# Patient Record
Sex: Male | Born: 2000
Health system: Western US, Academic
[De-identification: ages and names within clinical notes are randomized; demographics above are authoritative.]

## PROBLEM LIST (undated history)

## (undated) DIAGNOSIS — T148XXA Other injury of unspecified body region, initial encounter: Secondary | ICD-10-CM

## (undated) HISTORY — PX: SURGICAL HX OTHER: 99

## (undated) HISTORY — PX: TOTAL SHOULDER ARTHROPLASTY: SHX126

## (undated) HISTORY — PX: WISDOM TOOTH EXTRACTION: SHX5011

## (undated) HISTORY — PX: NASAL SEPTUM SURGERY: SHX37

## (undated) HISTORY — PX: TONSILLECTOMY AND ADENOIDECTOMY: SHX11139

## (undated) DEATH — deceased

---

## 2020-07-11 IMAGING — DX DX Fingers RT
1 series · 3 of 3 positions shown · non-contrast
Comparison: None available.

EXAM: Right fourth finger x-ray, 3 views
INDICATION: Pain

[Series 1: pa · right · 0.17mm/px · 3 of 3 slices shown]
[im 1/3]
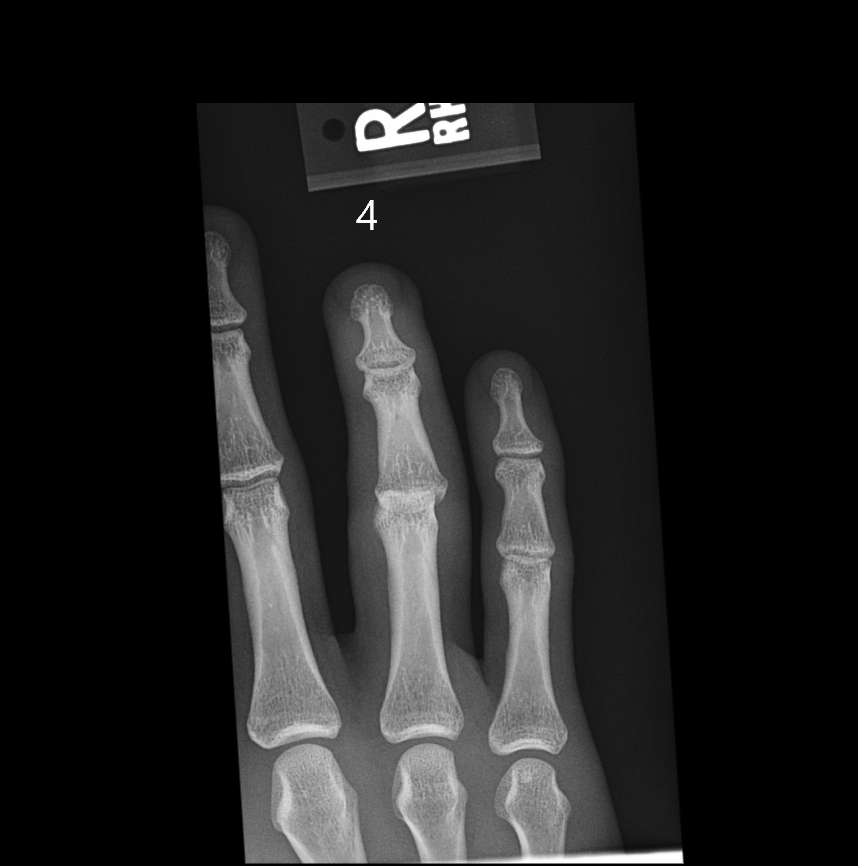
[im 2/3]
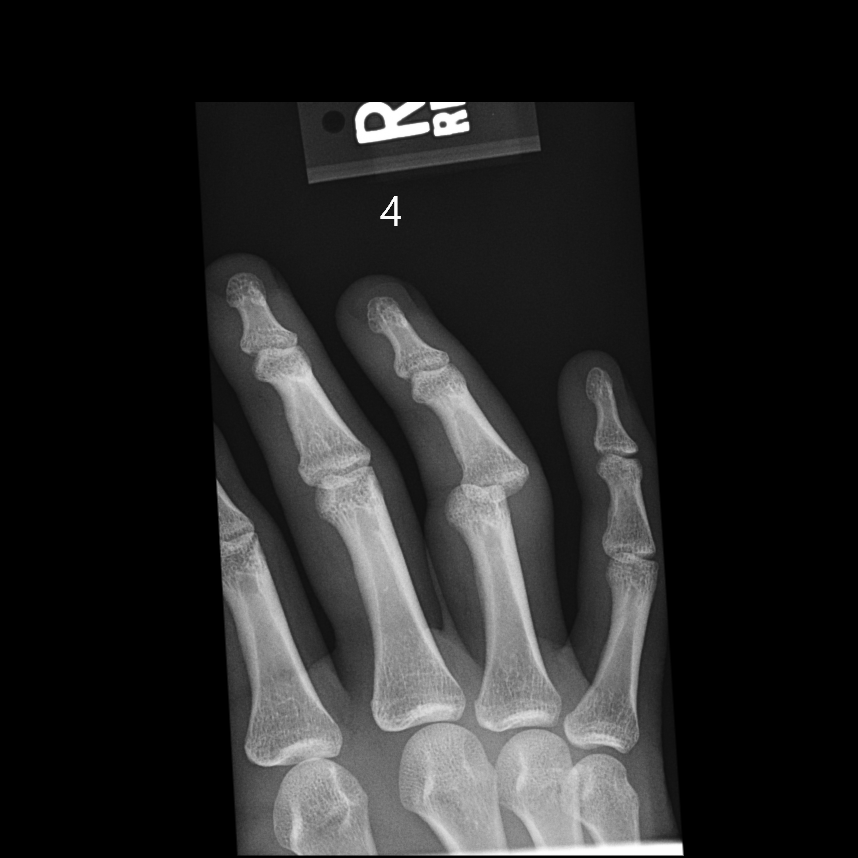
[im 3/3]
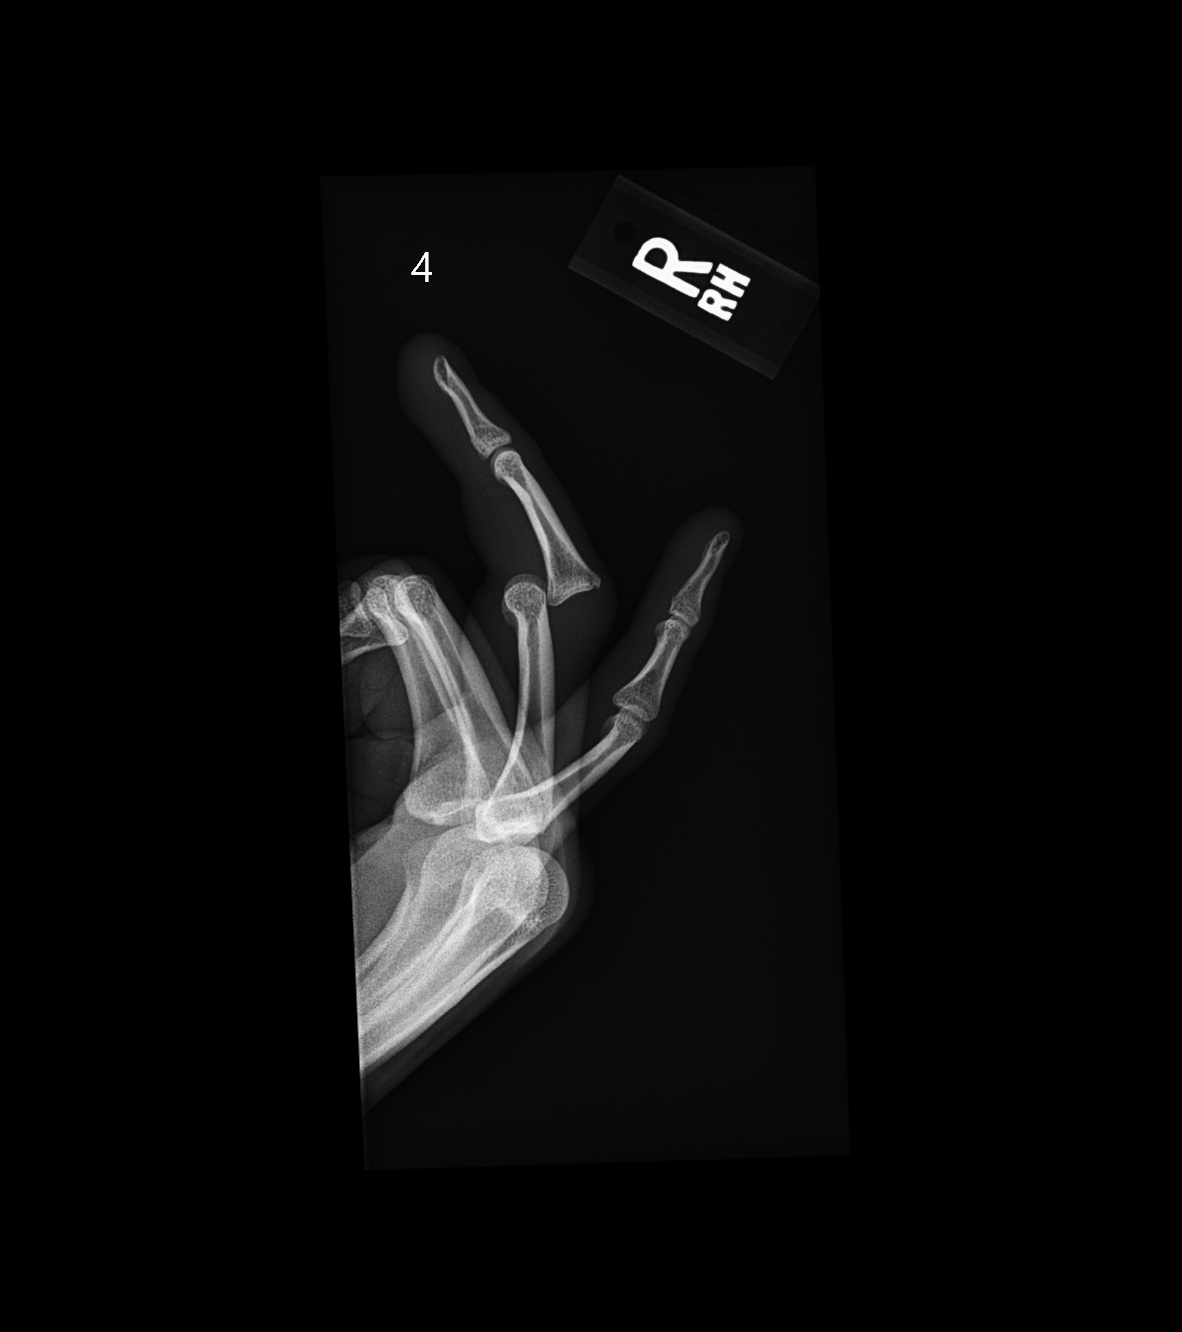

[3 of 3 positions shown; findings below may reference images not displayed]

IMPRESSION: Posterior proximal interphalangeal joint dislocation with 7 mm                
 overriding. Nondisplaced dorsal fracture of the middle phalanx at the joint               
 space. Bones are otherwise intact with normal joint alignment.

## 2020-11-25 ENCOUNTER — Other Ambulatory Visit (HOSPITAL_COMMUNITY): Payer: Self-pay

## 2020-12-02 ENCOUNTER — Other Ambulatory Visit (HOSPITAL_COMMUNITY): Payer: Self-pay

## 2021-01-14 ENCOUNTER — Other Ambulatory Visit (HOSPITAL_COMMUNITY): Payer: Self-pay

## 2021-03-11 ENCOUNTER — Other Ambulatory Visit (HOSPITAL_COMMUNITY): Payer: Self-pay

## 2021-06-02 ENCOUNTER — Other Ambulatory Visit (HOSPITAL_COMMUNITY): Payer: Self-pay

## 2021-08-03 ENCOUNTER — Encounter (HOSPITAL_BASED_OUTPATIENT_CLINIC_OR_DEPARTMENT_OTHER): Payer: Self-pay | Admitting: Orthopaedic Surgery

## 2021-08-03 ENCOUNTER — Telehealth (HOSPITAL_BASED_OUTPATIENT_CLINIC_OR_DEPARTMENT_OTHER): Payer: Self-pay | Admitting: Orthopaedic Surgery

## 2021-08-03 NOTE — Telephone Encounter (Signed)
RETURN CALL: Voicemail - Detailed Message      SUBJECT:  General Message     MESSAGE: Patient is a new patien and is scheduled for 10/26 and would like to get a better idea of when he would be able to schedule his surgery. He would like to know this information because he is having someone travelling to come help him out after surgery and he wants to figure out travel dates and airfare dates.     Please call patient to discuss. Thank you.

## 2021-08-04 NOTE — Telephone Encounter (Signed)
I spoke with this patient and informed him Dr. Everlean Patterson would discuss a date with him after he meets him in clinic. Fredricka Bonine is a new patient and it would not be appropriate give him a surgical date. During his appointment the can discuss how long the patient needs to have someone fly here to help him.    Kevin Simmons   Patient Care Coordinator  [facility] Department of Orthopedics  [phone]

## 2021-08-10 ENCOUNTER — Other Ambulatory Visit (HOSPITAL_COMMUNITY): Payer: Self-pay

## 2021-08-17 ENCOUNTER — Other Ambulatory Visit (HOSPITAL_BASED_OUTPATIENT_CLINIC_OR_DEPARTMENT_OTHER): Payer: Self-pay

## 2021-08-17 DIAGNOSIS — S82202D Unspecified fracture of shaft of left tibia, subsequent encounter for closed fracture with routine healing: Secondary | ICD-10-CM

## 2021-08-24 ENCOUNTER — Telehealth (HOSPITAL_BASED_OUTPATIENT_CLINIC_OR_DEPARTMENT_OTHER): Payer: Self-pay | Admitting: Orthopaedic Surgery

## 2021-08-24 NOTE — Telephone Encounter (Signed)
RETURN CALL: Voicemail - Detailed Message      SUBJECT:  General Message     MESSAGE: New patient scheduled to see [facility] Orth[facility] Fracture on Wednesday, 08/26/21 at 10am wanting to confirm outside medical records from  [facility] system x-rays, consultation records from patient's first surgery, have been received. Please call patient back directly prior to his appointment to confirm.

## 2021-08-25 NOTE — Telephone Encounter (Signed)
RETURN CALL: Voicemail - Detailed Message      SUBJECT:  General Message     MESSAGE:  Patient is following up again on medical records and wanting to know if clinic has received them. Please call patient back to further assist

## 2021-08-26 ENCOUNTER — Encounter (HOSPITAL_BASED_OUTPATIENT_CLINIC_OR_DEPARTMENT_OTHER): Payer: Self-pay

## 2021-08-26 ENCOUNTER — Ambulatory Visit
Admission: RE | Admit: 2021-08-26 | Discharge: 2021-08-26 | Disposition: A | Discharge status: Home / Self Care | Payer: No Typology Code available for payment source | Source: Ambulatory Visit | Attending: Diagnostic Radiology | Admitting: Diagnostic Radiology

## 2021-08-26 ENCOUNTER — Ambulatory Visit (HOSPITAL_BASED_OUTPATIENT_CLINIC_OR_DEPARTMENT_OTHER): Payer: No Typology Code available for payment source | Admitting: Physician Assistant

## 2021-08-26 VITALS — BP 115/62 | HR 93 | Temp 97.9°F | Resp 18 | Ht 70.0 in | Wt 152.0 lb

## 2021-08-26 DIAGNOSIS — S82422K Displaced transverse fracture of shaft of left fibula, subsequent encounter for closed fracture with nonunion: Secondary | ICD-10-CM

## 2021-08-26 DIAGNOSIS — S82202D Unspecified fracture of shaft of left tibia, subsequent encounter for closed fracture with routine healing: Secondary | ICD-10-CM | POA: Insufficient documentation

## 2021-08-26 NOTE — Progress Notes (Signed)
OUTPATIENT ORTHOPEDIC TRAUMA CLINIC NOTE     08/26/2021    ATTENDING SURGEON: Dr. Sherry Ruffing    Patient was seen with attending today.     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  In January of this year he was playing rugby and was tackled and had immediate onset of left leg pain.  He was evaluated by the sports medicine team there and noted to have tibia and fibular shaft fractures.  He was ultimately treated the next day the next day with an intramedullary nail of the tibia and the fibula was treated nonoperatively cleared.    Patient has followed up with his surgeon in [place] and at the last telemedicine visit in August was noted to have nonunion of the fibula and was recommended to undergo open reduction internal fixation.  Patient states that he has ongoing difficulties with the left leg.  He does not specifically describe pain but feels that the left leg is weak and there is a aching in the posterior portion of his leg with any type of exertion.  He has been unable to return to running jogging or other sports and desires to do so.      Since the injury he is had decreased sensation in the lateral and dorsal portion of his left foot this is improved over time but continues to remain decreased compared to the contralateral limb.  He otherwise has normal range of motion in the ankle and the knee.  He does not report any significant knee pain.      PAST MEDICAL HISTORY:  Anxiety  Erectile disfunction       MEDICATIONS  taldenofil 5-20mg  per day  Cabergoline 0.5mg  twice a week  Xanax 0.5 tid    Sudafed prn  mucinex prn  Tylenol prn    PAST SURGICAL HISTORY   IMN left tibia 11/2020       SOCIAL HISTORY      lives alone    Current work status: Sport and exercise psychologist  Tobacco use: NO  Alchohol use: YES; 14 per week    REVIEW OF SYSTEMS  Denies recent chest  pain, shortness of breath, palpitations, cough, runny nose, sore throat, fevers, chills, diarrhea, nausea and vomiting.       PHYSICAL EXAMINATION:   BP 115/62    Pulse 93    Temp 36.6 C (Temporal)    Resp 18    Ht 5\' 10"  (1.778 m)    Wt 68.9 kg (152 lb)    SpO2 99%    BMI 21.81 kg/m    GENERAL: is alert, oriented, appropriate, in no acute distress.      CARDIOVASCULAR:  Regular rate and rhythm.   normal capillary refill. Extremities are warm and well perfused.    LUNGS:normal respiratory effort.        FOCUSED MUSCULOSKELETAL EXAM:   MUSCULOSKELETAL: left lower extremity   Inspection: Incisions well healed, no drainage or surrounding erythema.  no edema noted on inspection.   Palpation/Tenderness: no calf tenderness to palpation     range of motion 0-130,   ankle dorsiflexion to +5, plantarflexion to 30.    Motor:   Quadriceps 5/5  Hamstrings 5/5  gastrocsoleus complex 5/5  tibialis anterior 5/5  EHL 5/5  FHL 5/5    With functional  testing he has obvious difficulty performing a single leg toe raise on the left and noted increased discomfort posteriorly after attempts to do so.   He performed 5 squats and noted increased pain in the left calf and posterior leg after doing so.     Sensation: is intact to light touch along tibial, sural, saphenous, deep and superficial peroneal nerve distributions. It is decreased in the lateral and dorsum of the foot and all toes.   Vascular:  isWarm and well-perfused, dorsalis pedis and posterior tibial pulse 2+.          IMAGING:     Plain Radiographs:  2 views of the LEFT  leg were obtained in clinic today and independently reviewed showing:  Well healed tibia fracture with well placed and well fixed IMN. No loosening or failure noted.   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     We recommend ORIF left fibula. This will be outpatient procedure with immediate wbat. Crutches from comfort if needed.   Will need 2-3 week wound check.     I have reviewed the expected post-operative recovery and physical therapy goals with the patient. We have discussed appropriate pain management goals and strategies. The patient was provided a handout regarding prescription opioids and the associated risks.      The nursing staff has reviewed the pre operative instructions and a copy was provided to the patient. Any relevant medications changes were made in writing and provided to the patient.      Risks and benefits of the procedure were discussed with the patient in depth. Risks include but are not limited to bleeding, infection, damage to surrounding structures,  incomplete resolution of pain, post operative infection, possible hardware failure, nonunion, need for further surgery, anesthetic complications including DVT and PE, heart attack, stroke or even death. he understands these risks and still wishes to proceed with the procedure.  Written consent was obtained from the patient and placed in the chart.    The patient was given ample opportunity to ask questions and all have been answered to their apparent satisfaction.     The patient was also seen with Dr. Sherry Ruffing who reviewed the surgical plan, risks and benefits with the patient.        No orders of the defined types were placed in this encounter.      Trisha Mangle, PA-C
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
## 2021-08-26 NOTE — Patient Instructions (Signed)
It was nice to see you today. Please let me know if you have any follow up questions that did not get answered.       We have discussed open reduction internal fixation of the left fibula nonunion. This will be a day surgery meaning you can go home an hour or so after surgery. You will need someone to pick you up from the hospital. You will be allowed to walk as tolerated. You may want to have crutches from comfort. Your wound will need to be checked about 2 weeks after surgery.     You should plan to start PT about 6 weeks after surgery.       Contact the clinic if concerning new symptoms    Ruben Gottron    [phone]  Orthopedic Trauma Clinic    [phone]  Ortho Pharmacy refill requests    For disability or work related forms -- Nicanor Bake [phone]    Orthopedic Trauma clinic   Patient Care Coordinators  Fax number: [phone]    Frutoso Chase [phone]     Haskell Riling: [phone]    Arrie Aran: [phone]

## 2021-08-31 ENCOUNTER — Encounter (HOSPITAL_BASED_OUTPATIENT_CLINIC_OR_DEPARTMENT_OTHER): Payer: No Typology Code available for payment source | Admitting: Medical

## 2021-09-21 ENCOUNTER — Telehealth (HOSPITAL_COMMUNITY): Payer: Self-pay

## 2021-09-21 ENCOUNTER — Ambulatory Visit: Payer: 59

## 2021-09-21 ENCOUNTER — Encounter (HOSPITAL_COMMUNITY): Payer: Self-pay | Admitting: Orthopaedic Surgery

## 2021-09-21 NOTE — Telephone Encounter (Signed)
PAC: Pre-Anesthesia instructions sent via My Chart on 09/21/2021

## 2021-09-21 NOTE — Preprocedure Instructions (Signed)
For any unexpected problems (such as cold, flu, or fever) or if you are unable to keep your appointment call one of the following:   The operating room-the evening prior to surgery after 8 pm, [phone]  Call ambulatory surgery unit [phone] on the day of surgery.     PRE ANESTHESIA INSTRUCTIONS:       Check in on the GROUND FLOOR of the [facility] BUILDING  on date and arrival time provided by your Surgeon's Office.     If you do not already have an arrival time: you will receive a call in the evening on the day before your surgery with your check in time.     If your surgery is on a Monday,  you will receive a call in the evening on Friday       Pre procedure instructions:     Drink plenty of water (6-8 glasses) the day BEFORE surgery.     Do NOT eat 6 hours prior to arrival.     Up until 2 hours prior to arrival you may have CLEAR liquids only (water, apple juice, Gatorade or Ensure clear) NO MILK OR MILK PRODUCTS.     Take: Tylenol if needed,  Alprazolam if needed, on the day of surgery with a small sip of water.     HOLD: Vitamins and supplements on the day of surgery    HOLD: Tadalafil for 1 day before surgery.    If you use inhalers, please use your inhalers on the morning of surgery and bring them with you to the hospital.    HOLD NSAIDS (ibuprofen, motrin, naproxen, aleve, ketorolac or diclofenac) 7 days prior to surgery.  Tylenol (acetaminophen is ok to take)    HOLD fish oil, flax seed oil and all herbal supplements 7 days prior to surgery.      If you have been diagnosed with Sleep Apnea, please bring your CPAP machine with you to the hospital on the day of surgery    NOTE: If a medication is marked TAKE OR HOLD, and you take that medication at night, THEN take it as your normally do the night prior.     Directions to [facility] Surgery Check-In Desk  BY CAR: Park in P2 garage (entrance is on [address], between [employer] and [employer] [address])   Take the parking garage elevators to L level [employer]).    Walk outside and you will see the Emergency and Trauma Center entrance on the opposite corner. Enter there.  Proceed through Covid Screening.  From there take the [facility] elevator up to 6th floor and cross the sky bridge.   Once over the sky bridge you will be in the [employer].   Take the [facility] elevator down to floor [address] [employer])   The check in is across the hall on your right.

## 2021-09-21 NOTE — Anesthesia Preprocedure Evaluation (Addendum)
Patient: Kevin Simmons    Procedure Information     Date/Time: 10/01/21 1015    Procedure: OPEN TREATMENT, FRACTURE, FIBULA, PROXIMAL OR SHAFT, WITH FIXATION (Left: Leg Lower)    Location: [facility] MAIN OR 10 / [facility] MAIN OR    Surgeons: Crissie Reese, MD        HPI: Pt is a 20 year old male with history of tib/fib fx s/p IM nail tib/fib. Presents with painful nonunion of left fibula shaft. Plan for ORIF left fibula.     Relevant Problems   No relevant active problems     Relevant surgical history:   Past Surgical History:   Procedure Laterality Date    IM Nail Tib/Fib Left     LAPAROSCOPIC ORCHIOPEXY 2002      NASAL SEPTUM SURGERY      TONSILLECTOMY AND ADENOIDECTOMY      TOTAL SHOULDER ARTHROPLASTY      WISDOM TOOTH EXTRACTION           Medications:     Outpatient:   Current Outpatient Medications   Medication Instructions    acetaminophen (TYLENOL) 500-1,000 mg, Oral, Every 6 hours PRN    ALPRAZolam 0.5 MG tablet TAKE 1 AND 1/2 TABLET BY MOUTH TWICE DAILY AS NEEDED FOR ANXIETY    cabergoline 0.5 MG tablet TAKE 1 TABLET (0.5 MG TOTAL) BY MOUTH 2 (TWO) TIMES A WEEK.    tadalafil (CIALIS) 5 mg, Oral, Daily                Review of patient's allergies indicates:  Allergies   Allergen Reactions    Aspirin Skin: Hives and Unknown    Ibuprofen Skin: Hives, Skin: Itching and Unknown       Social History:   Social History     Tobacco Use    Smoking status: Unknown   Substance Use Topics    Alcohol use: Yes     Alcohol/week: 14.0 standard drinks     Types: 14 Standard drinks or equivalent per week    Drug use: Yes     Types: Marijuana       Medical History and Review of Systems  Documentation reviewed: patient health questionnaire and electronic medical record.    Source of information: Chart review.  Previous anesthesia: Yes   History of anesthetic complications  (-) History of anesthetic complications.  (-) family history of anesthetic complications.      Functional Status   Able to lay flat and  still for 30 minutes, able to climb 2 flights of stairs or more without stopping, able to walk 1 city block (200 yards) and unable to exercise due to physical limitation.       Pulmonary   (+) productive cough (daily phlegm)    Neuro/Psych   (+) psychiatric history    Cardiovascular   Neg cardio ROS    HEENT   Neg HEENT ROS    Musculoskeletal  painful nonunion of left fibula shaft    Skin   negative skin ROS    GI/Hepatic/Renal   (+) GERD  (+) genitourinary problem (ED)    Endo/Immunology   neg endo/other ROS    Hematology   negative hematology ROS  Oncology   negative hematology/oncology ROS            Physical Exam  Airway  Mallampati:  I  TM distance:  >6 cm  Neck ROM:  Full    Dental    Cardiovascular  normal  Pulmonary  normal           Height:  [5\' 10"  (177.8 cm)] 5\' 10"  (177.8 cm)  Weight:  [68.9 kg (152 lb)] 68.9 kg (152 lb)   Body mass index is 21.81 kg/m.    Labs: (last year)   No labs identified within the last year      Relevant procedures / diagnostic studies:     PRE-ANESTHESIA CLINIC DISCUSSION   Assessment / Additional Concerns:      Instructions sent via My Chart on 09/21/2021.   Informed Consent:    Use of blood products:                ANESTHESIA PLAN   Informed Consent:     Anesthesia Plan discussed with:        Patient    ASA Score:     ASA: 1  Planned Anesthetic Type:      general      Risk Calculators / Scores:     PONV: Low Risk  Total Score: 1            Non-smoker        Criteria that do not apply:    Male patient    History of PONV    History of motion sickness    Intended opioid administration

## 2021-09-30 ENCOUNTER — Ambulatory Visit (HOSPITAL_BASED_OUTPATIENT_CLINIC_OR_DEPARTMENT_OTHER): Payer: Self-pay

## 2021-09-30 ENCOUNTER — Telehealth (HOSPITAL_BASED_OUTPATIENT_CLINIC_OR_DEPARTMENT_OTHER): Payer: Self-pay

## 2021-09-30 NOTE — Telephone Encounter (Signed)
Call Type:  Triage Call    Caller Name:  Ann Maki @Contact  Center    Facility Name:  [facility] [phone]    Presenting Problem:  PT has surgery tomorrow and has some questions. Clinic hasn't responded and they are closed. Went for pre-op and forgot to list Adderall under one of their medications, is this going to be a problem? Hasn't taken it today. No call from team today.    Assessment:  01. Symptoms: What are your symptoms? 2nd attempt, VM left  02. Onset: When did your symptoms/concern begin? (minutes, hours, days, or months ago?) n/a  03. Action: What actions or treatments have you taken to treat your symptoms/concern? n/a  04. Response: What was the response to your actions taken? n/a  05. Severity: How severe are your symptoms? (ex. interference with normal activities, pain scale, size or appearance of injury) n/a    Guideline Title:  No Contact or Duplicate Contact Call (Adult)    Guideline Question:  [1] Message left on unidentified voice mail.  Phone number verified.? Yes    Recommended Disposition:  No contact call    Original Inclination:  No Contact    Intended Action:  No contact call    Care Advice:  Care Advice given per No Contact or Duplicate Contact Call (Adult) guideline.  Note to [employer] - Unidentified Voice Mail:  * Follow organization policy for leaving a voice mail on an unidentified voice mail (e.g., no details about personal health information or nature of call).

## 2021-09-30 NOTE — Telephone Encounter (Signed)
RETURN CALL: Voicemail - Detailed Message      SUBJECT:  General Message     MESSAGE: Patient called back for an update. Ccr transferred to [facility] Medicine Nurse Line.

## 2021-09-30 NOTE — Telephone Encounter (Signed)
RETURN CALL: Voicemail - Detailed Message      SUBJECT:  General Message     MESSAGE: Patient has surgery tomorrow, 12/1, with Dr Everlean Patterson, and is concerned that his adderall prescription wasn't listed in his medication history, and has other questions regarding his procedure, please advise, thank you!

## 2021-10-01 ENCOUNTER — Other Ambulatory Visit (HOSPITAL_BASED_OUTPATIENT_CLINIC_OR_DEPARTMENT_OTHER): Payer: Self-pay

## 2021-10-01 ENCOUNTER — Ambulatory Visit (HOSPITAL_BASED_OUTPATIENT_CLINIC_OR_DEPARTMENT_OTHER): Payer: No Typology Code available for payment source

## 2021-10-01 ENCOUNTER — Encounter (HOSPITAL_COMMUNITY): Payer: Self-pay

## 2021-10-01 ENCOUNTER — Encounter (HOSPITAL_COMMUNITY)
Admission: RE | Disposition: A | Discharge status: Home / Self Care | Payer: Self-pay | Source: Home / Self Care | Attending: Orthopaedic Surgery

## 2021-10-01 ENCOUNTER — Ambulatory Visit (HOSPITAL_COMMUNITY): Payer: No Typology Code available for payment source

## 2021-10-01 ENCOUNTER — Ambulatory Visit
Admission: RE | Admit: 2021-10-01 | Discharge: 2021-10-01 | Disposition: A | Discharge status: Home / Self Care | Payer: No Typology Code available for payment source | Attending: Orthopaedic Surgery | Admitting: Orthopaedic Surgery

## 2021-10-01 ENCOUNTER — Ambulatory Visit (HOSPITAL_COMMUNITY): Payer: Self-pay | Admitting: Orthopaedic Surgery

## 2021-10-01 DIAGNOSIS — Z9889 Other specified postprocedural states: Secondary | ICD-10-CM | POA: Insufficient documentation

## 2021-10-01 DIAGNOSIS — S82452K Displaced comminuted fracture of shaft of left fibula, subsequent encounter for closed fracture with nonunion: Secondary | ICD-10-CM

## 2021-10-01 DIAGNOSIS — X58XXXD Exposure to other specified factors, subsequent encounter: Secondary | ICD-10-CM | POA: Insufficient documentation

## 2021-10-01 DIAGNOSIS — S82422K Displaced transverse fracture of shaft of left fibula, subsequent encounter for closed fracture with nonunion: Secondary | ICD-10-CM

## 2021-10-01 DIAGNOSIS — Z79899 Other long term (current) drug therapy: Secondary | ICD-10-CM | POA: Insufficient documentation

## 2021-10-01 HISTORY — DX: Other injury of unspecified body region, initial encounter: T14.8XXA

## 2021-10-01 SURGERY — OPEN TREATMENT, FRACTURE, FIBULA, PROXIMAL OR SHAFT, WITH FIXATION
Anesthesia: General | Site: Leg Lower | Laterality: Left | Wound class: Class I/ Clean

## 2021-10-01 MED ORDER — EPHEDRINE SULFATE (PRESSORS) 25 MG/5ML IV SOSY
PREFILLED_SYRINGE | INTRAVENOUS | Status: DC | PRN
Start: 2021-10-01 — End: 2021-10-01
  Administered 2021-10-01 (×2): 10 mg via INTRAVENOUS
  Administered 2021-10-01: 5 mg via INTRAVENOUS

## 2021-10-01 MED ORDER — HYDROMORPHONE HCL 1 MG/ML IJ SOLN
0.2000 mg | INTRAMUSCULAR | Status: DC | PRN
Start: 2021-10-01 — End: 2021-10-01
  Administered 2021-10-01 (×2): 0.4 mg via INTRAVENOUS

## 2021-10-01 MED ORDER — MIDAZOLAM HCL (PF) 2 MG/2ML IJ SOLN
INTRAMUSCULAR | Status: AC
Start: 2021-10-01 — End: 2021-10-01
  Filled 2021-10-01: qty 2

## 2021-10-01 MED ORDER — NEOSTIGMINE METHYLSULFATE 5 MG/10ML IV SOLN
INTRAVENOUS | Status: DC | PRN
Start: 2021-10-01 — End: 2021-10-01
  Administered 2021-10-01: 3.5 mg via INTRAVENOUS

## 2021-10-01 MED ORDER — ROCURONIUM BROMIDE 50 MG/5ML IV SOLN
INTRAVENOUS | Status: DC | PRN
Start: 2021-10-01 — End: 2021-10-01
  Administered 2021-10-01: 20 mg via INTRAVENOUS
  Administered 2021-10-01: 70 mg via INTRAVENOUS

## 2021-10-01 MED ORDER — ACETAMINOPHEN 500 MG OR TABS
ORAL_TABLET | ORAL | Status: AC
Start: 2021-10-01 — End: 2021-10-01
  Filled 2021-10-01: qty 2

## 2021-10-01 MED ORDER — ONDANSETRON HCL 4 MG/2ML IJ SOLN
4.0000 mg | INTRAMUSCULAR | Status: DC | PRN
Start: 2021-10-01 — End: 2021-10-01

## 2021-10-01 MED ORDER — NEOSTIGMINE METHYLSULFATE 5 MG/10ML IV SOLN
INTRAVENOUS | Status: AC
Start: 2021-10-01 — End: 2021-10-01
  Filled 2021-10-01: qty 10

## 2021-10-01 MED ORDER — EPHEDRINE SULFATE (PRESSORS) 25 MG/5ML IV SOSY
PREFILLED_SYRINGE | INTRAVENOUS | Status: AC
Start: 2021-10-01 — End: 2021-10-01
  Filled 2021-10-01: qty 5

## 2021-10-01 MED ORDER — ROCURONIUM BROMIDE 50 MG/5ML IV SOLN
INTRAVENOUS | Status: AC
Start: 2021-10-01 — End: 2021-10-01
  Filled 2021-10-01: qty 10

## 2021-10-01 MED ORDER — ONDANSETRON HCL 4 MG/2ML IJ SOLN
INTRAMUSCULAR | Status: DC | PRN
Start: 2021-10-01 — End: 2021-10-01
  Administered 2021-10-01: 4 mg via INTRAVENOUS

## 2021-10-01 MED ORDER — ONDANSETRON HCL 4 MG/2ML IJ SOLN
INTRAMUSCULAR | Status: AC
Start: 2021-10-01 — End: 2021-10-01
  Filled 2021-10-01: qty 2

## 2021-10-01 MED ORDER — PHENYLEPHRINE HCL-NACL 1-0.9 MG/10ML-% IV SOSY
PREFILLED_SYRINGE | INTRAVENOUS | Status: DC | PRN
Start: 2021-10-01 — End: 2021-10-01
  Administered 2021-10-01 (×3): 100 ug via INTRAVENOUS

## 2021-10-01 MED ORDER — MIDAZOLAM HCL (PF) 1 MG/ML IJ SOLN WRAPPER (ANESTHESIA OSM ONLY)
INTRAMUSCULAR | Status: DC | PRN
Start: 2021-10-01 — End: 2021-10-01
  Administered 2021-10-01: 2 mg via INTRAVENOUS

## 2021-10-01 MED ORDER — PHENYLEPHRINE HCL-NACL 1-0.9 MG/10ML-% IV SOSY
PREFILLED_SYRINGE | INTRAVENOUS | Status: AC
Start: 2021-10-01 — End: 2021-10-01
  Filled 2021-10-01: qty 10

## 2021-10-01 MED ORDER — CEFAZOLIN SODIUM 1 G IJ SOLR
INTRAMUSCULAR | Status: DC | PRN
Start: 2021-10-01 — End: 2021-10-01
  Administered 2021-10-01: 2 g via INTRAVENOUS

## 2021-10-01 MED ORDER — HALOPERIDOL LACTATE 5 MG/ML IJ SOLN
1.0000 mg | Freq: Once | INTRAMUSCULAR | Status: DC | PRN
Start: 2021-10-01 — End: 2021-10-01

## 2021-10-01 MED ORDER — SODIUM CHLORIDE 0.9 % IV SOLN
INTRAVENOUS | Status: DC | PRN
Start: 2021-10-01 — End: 2021-10-01
  Administered 2021-10-01 (×3): 8 ug via INTRAVENOUS

## 2021-10-01 MED ORDER — IODINE TINCTURE 1% TOPICAL SOLUTION (UWDS)
Status: AC
Start: 2021-10-01 — End: 2021-10-01
  Filled 2021-10-01: qty 473

## 2021-10-01 MED ORDER — GLYCOPYRROLATE 0.2 MG/ML IJ SOLN
INTRAMUSCULAR | Status: AC
Start: 2021-10-01 — End: 2021-10-01
  Filled 2021-10-01: qty 3

## 2021-10-01 MED ORDER — DEXAMETHASONE SODIUM PHOSPHATE 4 MG/ML IJ SOLN
INTRAMUSCULAR | Status: DC | PRN
Start: 2021-10-01 — End: 2021-10-01
  Administered 2021-10-01: 4 mg via INTRAVENOUS

## 2021-10-01 MED ORDER — PLASMA-LYTE A IV SOLN
INTRAVENOUS | Status: DC | PRN
Start: 2021-10-01 — End: 2021-10-01

## 2021-10-01 MED ORDER — FENTANYL CITRATE (PF) 100 MCG/2ML IJ SOLN
INTRAMUSCULAR | Status: AC
Start: 2021-10-01 — End: 2021-10-01
  Filled 2021-10-01: qty 2

## 2021-10-01 MED ORDER — FENTANYL CITRATE (PF) 100 MCG/2ML IJ SOLN
12.5000 ug | INTRAMUSCULAR | Status: DC | PRN
Start: 2021-10-01 — End: 2021-10-01

## 2021-10-01 MED ORDER — OXYCODONE HCL 5 MG OR TABS
ORAL_TABLET | ORAL | Status: DC
Start: 2021-10-01 — End: 2021-10-01
  Filled 2021-10-01: qty 2

## 2021-10-01 MED ORDER — OXYCODONE HCL 5 MG OR TABS
5.0000 mg | ORAL_TABLET | ORAL | 0 refills | Status: DC | PRN
Start: 2021-10-01 — End: 2021-10-02
  Filled 2021-10-01: qty 25, 5d supply, fill #0

## 2021-10-01 MED ORDER — PROPOFOL 200 MG/20ML IV EMUL
INTRAVENOUS | Status: AC
Start: 2021-10-01 — End: 2021-10-01
  Filled 2021-10-01: qty 20

## 2021-10-01 MED ORDER — DEXAMETHASONE SODIUM PHOSPHATE 4 MG/ML IJ SOLN
INTRAMUSCULAR | Status: AC
Start: 2021-10-01 — End: 2021-10-01
  Filled 2021-10-01: qty 1

## 2021-10-01 MED ORDER — CEFAZOLIN SODIUM 1 G IJ SOLR
INTRAMUSCULAR | Status: AC
Start: 2021-10-01 — End: 2021-10-01
  Filled 2021-10-01: qty 2

## 2021-10-01 MED ORDER — HYDROMORPHONE HCL 1 MG/ML IJ SOLN
INTRAMUSCULAR | Status: DC | PRN
Start: 2021-10-01 — End: 2021-10-01
  Administered 2021-10-01 (×2): 1 mg via INTRAVENOUS

## 2021-10-01 MED ORDER — PROPOFOL 10 MG/ML IV EMUL WRAPPER (OSM ONLY)
INTRAVENOUS | Status: DC | PRN
Start: 2021-10-01 — End: 2021-10-01
  Administered 2021-10-01: 150 mg via INTRAVENOUS

## 2021-10-01 MED ORDER — GLYCOPYRROLATE 0.2 MG/ML IJ SOLN
INTRAMUSCULAR | Status: DC | PRN
Start: 2021-10-01 — End: 2021-10-01
  Administered 2021-10-01: .6 mg via INTRAVENOUS

## 2021-10-01 MED ORDER — OXYCODONE HCL 5 MG OR TABS
5.0000 mg | ORAL_TABLET | Freq: Once | ORAL | Status: AC
Start: 2021-10-01 — End: 2021-10-01
  Administered 2021-10-01: 10 mg via ORAL

## 2021-10-01 MED ORDER — FENTANYL CITRATE (PF) 50 MCG/ML IJ SOLN WRAPPER (ANESTHESIA OSM ONLY)
INTRAMUSCULAR | Status: DC | PRN
Start: 2021-10-01 — End: 2021-10-01
  Administered 2021-10-01: 100 ug via INTRAVENOUS

## 2021-10-01 MED ORDER — ACETAMINOPHEN 325 MG OR TABS
ORAL_TABLET | ORAL | Status: DC | PRN
Start: 2021-10-01 — End: 2021-10-01
  Administered 2021-10-01: 1000 mg via ORAL

## 2021-10-01 MED ORDER — LIDOCAINE HCL (PF) 1 % IJ SOLN
INTRAMUSCULAR | Status: AC
Start: 2021-10-01 — End: 2021-10-01
  Filled 2021-10-01: qty 5

## 2021-10-01 MED ORDER — ACETAMINOPHEN 500 MG OR TABS
500.0000 mg | ORAL_TABLET | Freq: Four times a day (QID) | ORAL | 0 refills | Status: AC | PRN
Start: 2021-10-01 — End: ?
  Filled 2021-10-01: qty 90, 23d supply, fill #0

## 2021-10-01 MED ORDER — HYDROMORPHONE HCL 1 MG/ML IJ SOLN
INTRAMUSCULAR | Status: AC
Start: 2021-10-01 — End: 2021-10-01
  Filled 2021-10-01: qty 1

## 2021-10-01 MED ORDER — HYDROMORPHONE HCL 1 MG/ML IJ SOLN
INTRAMUSCULAR | Status: DC
Start: 2021-10-01 — End: 2021-10-01
  Filled 2021-10-01: qty 1

## 2021-10-01 MED ORDER — IBUPROFEN 600 MG OR TABS
600.0000 mg | ORAL_TABLET | Freq: Four times a day (QID) | ORAL | 0 refills | Status: DC | PRN
Start: 2021-10-01 — End: 2021-10-01
  Filled 2021-10-01: qty 90, 23d supply, fill #0

## 2021-10-01 SURGICAL SUPPLY — 31 items
BANDAGE ADHESIVE PRIMAPORE 13 3/4INX4IN ISLAND (Dressing) ×2 IMPLANT
BANDAGE ESMARK 9FTX6IN SMOOTH FIN STERILE (Dressing) ×2 IMPLANT
BANDAGE MATRIX 15YDX6IN MED ELASTIC STERILE (Dressing) ×2 IMPLANT
BLADE SURG BARD-PARKER CARBON STEEL 15 (Blade) ×6 IMPLANT
COVER EQUIP AMD 36INX30IN BAG FLUOROSCOPE (Drape) ×2 IMPLANT
COVER REINFORCE FANFOLD LF DISP 90X50IN TABLE (Other) ×1
CUFF TOURNIQUET 34INX4IN 2PORT BLADDER DISP REPROCESSED (Other) ×2 IMPLANT
DRAPE 133INX121INX90IN FENESTRATE (Drape) ×2 IMPLANT
DRAPE 76INX54IN IMPERVIOUS SPLIT (Drape) ×2 IMPLANT
DRAPE 90INX50IN REINFORCE FANFOLD (Other) ×1 IMPLANT
DRAPE CLOTH 54IN (Drape) ×1 IMPLANT
DRAPE CLOTH 54INCH (Drape) ×2
DRAPE EQUIP C-ARMOR EXPAND C ARM FLUOROSCOPE (Drape) ×2 IMPLANT
DRAPE IOBAN 60CM X 45CM (Drape) ×2 IMPLANT
DRESSING PETROLATUM ADAPTIC 16INX3IN (Dressing) ×2 IMPLANT
LINEN PACK (Other) ×2 IMPLANT
PACK CUST ORTHO (Pack) ×2 IMPLANT
PLATE LC-DCP 8H 103MMX11MMX3.3MM (Plate) ×2 IMPLANT
SCREW CORTEX 2.7MM X 16MM SELF-TAP (Screw) ×2 IMPLANT
SCREW CORTEX 3.5MM 14MM SELF TAP 204.814 (Screw) ×2 IMPLANT
SCREW CORTEX 3.5MM 16MM SELF TAP 204.816 (Screw) ×8 IMPLANT
SCREW CORTEX 3.5MM 20MM SELF TAP 204.820 (Screw) ×2 IMPLANT
SPLINT LEG/CAST LEG (Cast) ×2 IMPLANT
SPONGE LAPAROTOMY 18X18IN 1IN 4 PLY RADIOPAUE (Sponge) ×2 IMPLANT
STAPLER SKIN WIDE 35 COUNT STERILE LF VISISTAT (Closure Device) ×2 IMPLANT
SUTURE CAPROSYN 3-0 P-12 18IN UNDYED (Suture) ×2 IMPLANT
SUTURE DERMALON 3-0 C-14 30IN BLUE (Suture) ×4 IMPLANT
TIP SUCTION ARGYLE CURITY 10FR REM OBDURATOR (Other) ×2 IMPLANT
TIP SUCTION ARGYLE CURITY 12FR REM OBTURATOR (Other) ×2 IMPLANT
TUBE SUCTION BARON 7FR CONTROL VENT (Tubing) ×2 IMPLANT
WATER STERILE 1000ML PLSTC POUR BOTTLE LF (Solution) ×2 IMPLANT

## 2021-10-01 NOTE — Anesthesia Postprocedure Evaluation (Signed)
Patient: Kevin Simmons    Procedure Summary     Date: 10/01/21 Room / Location: [facility] MAIN OR 10 / [facility] MAIN OR    Anesthesia Start: 1516 Anesthesia Stop: 1745    Procedure: OPEN TREATMENT, PROXIMAL OR SHAFT FIBULA FRACTURE, WITH FIXATION (Left: Leg Lower) Diagnosis:       Closed disp transverse fracture of shaft of left fibula with nonunion      (Closed disp transverse fracture of shaft of left fibula with nonunion [C58.850Y])    Surgeons: Crissie Reese, MD Responsible Provider: Sandy Salaam, MD    Anesthesia Type: general ASA Status: 1        Final Anesthesia Type: general    Vitals Value Taken Time   BP 95/53 10/01/21 1815   Temp 36.5 C 10/01/21 1738   Pulse 54 10/01/21 1817   SpO2 100 % 10/01/21 1817   Vitals shown include unvalidated device data.    Place of evaluation: PACU    Patient participation: patient participated    Level of consciousness: fully conscious    Patient pain control satisfaction: patient is satisfied with level of pain control    Airway patency: patent    Cardiovascular status during assessment: stable    Respiratory status during assessment: breathing comfortably    Anesthetic complications: no    Intravascular volume status assessment: euvolemic          Planned post-operative disposition at time of assessment: hospital discharge

## 2021-10-01 NOTE — Procedure Nursing Note (Signed)
VSS, Taking PO well. Amb. To W/C with Crutches and understands WBAT .  No Nausea and pain relief adequate.

## 2021-10-01 NOTE — H&P (Signed)
History and Physical     Kevin Simmons ("Kevin Simmons") - DOB: 04/14/2001 (20 year old male)  Gender Identity: Male  Preferred Pronouns: he/him/his  PCP: Pcp, None   Code Status: No Order       CC: Closed disp transverse fracture of shaft of left fibula with nonunion     SUBJECTIVE   Kevin Simmons is a 20 year old male with Closed disp transverse fracture of shaft of left fibula with nonunion [S82.422K]. He was seen recently in clinic, where a detailed review of HPI can be found.  He was noted to benefit from OPEN TREATMENT, PROXIMAL OR SHAFT FIBULA FRACTURE, WITH FIXATION - Left.       OBJECTIVE     Vitals (Arrival)      T: 36.3 C (10/01/21 1330)  BP: 109/63 (10/01/21 1330)  HR: (!) 47 (10/01/21 1330)  RR: 14 (10/01/21 1330)  SpO2: 100 % (10/01/21 1330) Room air   Vitals (Most recent in last 24 hrs)   T: 36.3 C (10/01/21 1330)  BP: 109/63 (10/01/21 1330)  HR: (!) 47 (10/01/21 1330)  RR: 14 (10/01/21 1330)  SpO2: 100 % (10/01/21 1330) Room air  T range: Temp  Min: 36.3 C  Max: 36.3 C  Wt 152 lb (68.947 kg)     Ht 5' 10"  (1.778 m)     Body mass index is 21.81 kg/m.       General:  Well developed, appearing stated age and in no acute distress  Mental Status:A&O x 3  Cardiovascular:normal exam and normal rate and rhythm  Lungs:   CTAB, No rhonchi, rales, or wheezes auscultated  Surgical Site: Left lower leg    Labs (last 24 hours):   Chemistries  CBC  LFT  Gases, other   - - - -   -   AST: - ALT: -  -/-/-/-   -/-/-/-   - - -   - >< -  AP: - T bili: -  Lact (a): - Lact (v): -   eGFR: - Ca: -   -   Prot: - Alb: -  Trop I: - D-dimer: -   Mg: - PO4: -  ANC: -     BNP: - Anti-Xa: -     ALC: -    INR: -         ASSESSMENT/PLAN    Kevin Simmons is a 20 year old male with Closed disp transverse fracture of shaft of left fibula with nonunion [S82.422K], who presents for OPEN TREATMENT, PROXIMAL OR SHAFT FIBULA FRACTURE, WITH FIXATION - Left. Office-obtained consent is accurate; further questions about the procedure(s) were  answered..  Peri-operative prophylactic antibiotics will be administered according to pre-operative checklist .     Kevin Fiscal, MD

## 2021-10-01 NOTE — Discharge Instructions (Addendum)
Anesthesia: After Your Surgery    Youve just had surgery. During surgery, you received medication called anesthesia to keep you comfortable and pain-free. After surgery, you may experience some pain or nausea. This is normal. Here are some tips for feeling better and recovering after surgery.    Going Home    Your doctor or nurse will show you how to take care of yourself when you go home. He or she will also answer your questions. Have an adult family member or friend drive you home. For the first 24 hours after your surgery:  Do not drive or use heavy equipment.  Do not make important decisions or sign legal documents.  Avoid alcohol.  Have someone stay with you, if needed. He or she can watch for problems and help keep you safe.  Be sure to keep all follow-up doctors appointments. And rest after your procedure for as long as your doctor tells you to.    What To Expect    You may feel drowsy and have minor side effects after your procedure or surgery with anesthesia. These side effects include:  Sore throat  Headache  Muscle aches  Dizziness, off and on  Nausea  Vomiting     Some of these symptoms may be from the pain medicine you are taking. The side effects from anesthesia usually go away quickly in the hours after your procedure. Still, it may take several days for your body to recover from the stress of surgery and anesthesia.    Coping with Pain    If you have pain after surgery, pain medication will help you feel better. Take it as directed, before pain becomes severe. Also, ask your doctor or pharmacist about other ways to control pain, such as with heat, ice, and relaxation. And follow any other instructions your surgeon or nurse gives you.    Tips for Taking Pain Medication    To get the best relief possible, remember these points:  Pain medications can upset your stomach. Taking them with a little food may help.  Most pain relievers taken by mouth need at least 20 to 30 minutes to take effect.  Taking  medication on a schedule can help you remember to take it. Try to time your medication so that you can take it before beginning an activity, such as dressing, walking, or sitting down for dinner.  Constipation is a common side effect of pain medications. Contact your doctor before taking any medications like laxatives or stool softeners to help relieve constipation. Also ask about any dietary restrictions, because drinking lots of fluids and eating foods like fruits and vegetables that are high in fiber can also help. Remember, dont take laxatives unless your surgeon has prescribed them.  Mixing alcohol and pain medication can cause dizziness and slow your breathing. It can even be fatal. Dont drink alcohol while taking pain medication.  Pain medication can slow your reflexes. Dont drive or operate machinery while taking pain medication.  If your health care provider advises you to take acetaminophen, the generic name for Tylenol and other brand-name pain relievers, to help relieve your pain, ask for a daily dose. Remember that acetaminophen or other pain relievers may interact with prescription medicines or other over-the-counter (OTC) drugs. The FDA recommends reading OTC medication labels carefully to clearly understand the list of active ingredients, directions, and any precautions to help avoid taking too much acetaminophen. If you have questions, ask your pharmacist or health care provider.  Managing Nausea    Some people have an upset stomach after surgery. This is often due to anesthesia, pain, pain medications, or the stress of surgery. The following tips will help you manage nausea and get good nutrition as you recover. If you were on a special diet before surgery, ask your doctor if you should follow it during recovery. These tips may help:  Dont push yourself to eat. Your body will tell you what to eat and when.  Start off with clear liquids and soup. They are easier to digest.  Progress to  semisolids (mashed potatoes, applesauce, and gelatin) as you feel ready.  Slowly move to solid foods. Dont eat fatty, rich, or spicy foods at first.  Dont force yourself to have three large meals a day. Instead, eat smaller amounts more often.  Take pain medications with a small amount of solid food, such as crackers or toast to avoid nausea.    Urinary Retention    Urinary retention (not being able to urinate) may occur after some procedures. If you are unable to urinate within 8 hours of going home after your procedure, or if your bladder feels painful and full, call your doctor. Allowing your bladder to get too full can cause serious problems. You may need to go to the emergency room for treatment.    Call Your Surgeon If   You still have uncontrolled pain an hour after taking medication (it may not be strong enough).   You feel too sleepy, dizzy, or groggy (medication may be too strong).   You have side effects like nausea, vomiting, or skin changes (rash, itching, or hives).     Orthopedic Surgery Discharge Instructions   You may be weight bearing as tolerated on your left lower extremity, range of motion as tolerated.  Leave the ACE Wrap in place for 2-3 days then May remove.  Leave the Gauze dressing in place for 3 days then may remove.   After 3 days you may shower - No soaking in bath, hot or swimming - you may cover the incision daily with dry gauze.   Resume your regular diet    No strenuous activity, no running ,no jumping , no sports until seen in Follow Up clinic.     You may take Tylenol for pain and Oxycodone as needed for pain.

## 2021-10-01 NOTE — Brief Op Note (Addendum)
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  Estimated Blood Loss:10 mL     Wound Closure: Primary Closure (Primary Intention)  Wound Class: Procedure(s):  OPEN TREATMENT, PROXIMAL OR SHAFT FIBULA FRACTURE, WITH FIXATION - Wound Class: Class I/ Clean     SPECIMENS:   No specimens were documented in this log.    FINDINGS    None    POSTOP PLAN  Operative Plan: Complete  Notable intraoperative issues: None   Activity: LLE Weightbearing as tolerated  ROM restrictions: ROMAT  Wound Care: Dry Dressings, keep until POD 3, reinforce PRN  Nutrition: ADAT General Diet  Antibiotics: None Abx Indicated  Anticoagulation: not indicated  Pain Plan: Tylenol, ibuprofen  TLD: None  Radiology: Complete  Primary Ortho Team/Attending: [facility] / Freida Busman, MD   Disposition: PACU to Home

## 2021-10-01 NOTE — Anesthesia Procedure Notes (Signed)
Airway Placement    Staff:  Performing Provider: Trainee, Unlisted Anesthesia  Wyline Beady (Paramedic Student)  Authorizing Provider: Sandy Salaam, MD    Airway management:   Patient location: OR/Procedural area  Final airway type: Endotracheal airway  Intubation reason: General anesthesia    Induction:  Positioning: supine  Patient was pre-oxygenated: yes  Mask Ventilation: Grade 1 - Ventilated by mask     Intubation:    Final Attempt   Airway Type: ETT  Primary Laryngoscopy: Macintosh  Blade Size: 3  Laryngoscopic View: Grade I  ETT Type: standard, cuffed  ETT Route: oral  Size: 7.5  ETT secured with adhesive tape  Depth at: lips  (22cm)    Number of Attempts: 1    Assessment:  Confirmation: auscultation, waveform capnography and direct visualization  Procedure Abandoned: no    Date / Time Airway Secured / Re-Secured:  10/01/2021 3:31 PM    Final procedure comments:  Intubation performed by trainee listed above.

## 2021-10-02 ENCOUNTER — Telehealth (HOSPITAL_BASED_OUTPATIENT_CLINIC_OR_DEPARTMENT_OTHER): Payer: Self-pay | Admitting: Orthopaedic Surgery

## 2021-10-02 ENCOUNTER — Telehealth (HOSPITAL_BASED_OUTPATIENT_CLINIC_OR_DEPARTMENT_OTHER): Payer: Self-pay | Admitting: Pharmacist

## 2021-10-02 DIAGNOSIS — Z4789 Encounter for other orthopedic aftercare: Secondary | ICD-10-CM

## 2021-10-02 MED ORDER — OXYCODONE HCL 5 MG OR TABS
5.0000 mg | ORAL_TABLET | Freq: Four times a day (QID) | ORAL | 0 refills | Status: DC | PRN
Start: 2021-10-02 — End: 2021-10-21

## 2021-10-02 MED ORDER — NALOXONE HCL 4 MG/0.1ML NA LIQD
NASAL | 0 refills | Status: AC
Start: 2021-10-02 — End: ?

## 2021-10-02 NOTE — Addendum Note (Signed)
Addended by: Rolena Infante on: 10/02/2021 04:45 PM     Modules accepted: Orders

## 2021-10-02 NOTE — Addendum Note (Signed)
Addended by: Rolena Infante on: 10/02/2021 04:44 PM     Modules accepted: Orders

## 2021-10-02 NOTE — Telephone Encounter (Addendum)
ORTHOPEDIC PHARMACY NOTE       Patient Phone: [phone]   Pharmacy: [facility] [address] [facility] [place]     Referring Physician: Everlean Patterson    Last Appt: OR 10/01/21    Next Appt: 10/16/21    Six week post injury/surgery date: 11/12/21    Diagnosis: 10/01/21 OPEN TREATMENT, PROXIMAL OR SHAFT FIBULA FRACTURE, WITH FIXATION    Current Medication Requested: 10/01/21 oxycodone 5 mg #25 1 q4h prn severe pain.     Other Pertinent Information: per PMP chronic alprazolam 0.5mg  #90 per 30 days (filled 11/30)    Review of patient's allergies indicates:  Allergies   Allergen Reactions    Aspirin Skin: Hives and Unknown    Ibuprofen Skin: Hives, Skin: Itching and Unknown     Assessment/Plan: Received electronic request and voice message from patient regarding oxycodone. He reports taking oxycodone 5 mg 2 tablets q 5h, but likely less by the numbers. He reports having 17 tablets on hand. He reports taking Tylenol as directed. Advised that he increase to 1000mg  q6h for better pain control. He verbalized understanding. Counseled on drug interaction opioid with alprazolam, which could cause dangerous overlapping side effects. Discussed the need for naloxone prescription and risks for oversedation or respiratory depression.  Patient verbalized understanding of how and why naloxone would be used.  Oxycodone 5mg  #40 1-2 q6 hours prn pain. Naloxone nasal spray 4 mg/0.1 ml #2 use one spray in one nostril for suspected opioid overdose. Call 911. If unresponsive in 2-3 minutes repeat with new naloxone nasal spray.     Orders Placed This Encounter    oxyCODONE 5 MG tablet    naloxone 4 MG/0.1ML nasal spray     Naloxone prescribed: 10/02/21  Opioid education materials: discharge AVS 10/01/21

## 2021-10-02 NOTE — Telephone Encounter (Signed)
I, Neasia Fleeman, was present for the call, helped formulate the plan, wrote the prescriptions and coauthored this note.

## 2021-10-02 NOTE — Telephone Encounter (Signed)
RETURN CALL: Voicemail - Detailed Message      SUBJECT:  General Message     MESSAGE: Urgent per caller    Per patient - he's returning call to clinic for Post Op questions/concerns.     He has swollen glands and a sore throat and is not sure if that is normal. He also felt nauseous last night after he got home and threw up twice.     Per patient, he's also in a good amount of pain and is asking if a refill for pain medication can be sent for him asap    Please send refill to: [facility]  [facility]  [address],   [place], [place] [address]  Phone: [phone]  Fax: [phone]    Thank you

## 2021-10-03 NOTE — Op Note (Signed)
Operative Report     Patient Name: Kevin Simmons, Kevin Simmons.  Date of Service: October 01, 2021    Patient ID: [id number] Date of Birth: 2001-09-21    Clinician: Crissie Reese, MD Facility: [facility]   Location: [facility]         PREOPERATIVE DIAGNOSIS:    Left fibular shaft nonunion.     POSTOPERATIVE DIAGNOSIS:    Left fibular shaft nonunion.     PROCEDURE:    Treatment of left fibular shaft nonunion with plate and screw construct.     SURGEON:    Freida Busman, MD.     ASSISTANTHettie Holstein Obilofor     PRESENCE STATEMENT:   I was present for the key and critical components and Dr. Garth Bigness was available for emergencies.     ANESTHESIA:    General.     ESTIMATED BLOOD LOSS:  Please see anesthesia record.       INTRAVENOUS FLUIDS:    Please see anesthesia record.     URINE OUTPUT:    Please see anesthesia record.     INDICATIONS:    The patient presented to my clinic with left leg pain in the general area of his lateral leg in the area of the fibular nonunion demonstrated by imaging.  Discussed with him options including nonoperative treatment, physical therapy versus operative treatment of this.  I discussed risks of surgery including bleeding, infection, damage to nearby nerves, vessels, persistent nonunion, no alleviation of pain symptoms, need for additional procedures including removal of hardware as well as revision surgery were counseled to the patient.  The patient wishes and elects to proceed ahead with surgical intervention.     DESCRIPTION OF PROCEDURE:    The patient was brought to the operating room and underwent general anesthesia and placed in the supine position.  His left lower extremity was prepped and draped in standard sterile fashion hard stop timeout was performed.  Preoperative antibiotics administered prior to procedure.  We began by localizing the spot of the malunion on fluoroscopy.  We then made a posterolateral approach to the fibula, dissected through skin and subcutaneous tissue  with a knife and electrocautery.  Then, split the fascia of the lateral compartment to expose the peroneals.  These were retracted posteriorly.  The superficial peroneal nerve was identified and protected at all times.  Elevated some of the peroneal muscle origin off to expose the fibular nonunion.  I then used a 3.5 mm LCD 2 plate and placed a small precontoured bend on it and then placed it along the lateral aspect of the fibula, placed a screw in the distal end to create an apex to allow compression plating, placed another second screw just distal to this one.  I then placed 3 screws in a row in a series all drilling eccentrically to provide compression across the fracture.  Also, a 2.7 screw obliquely lagged across the fracture as well.  Obtained fluoroscopic imaging demonstrating safe placement of the implants.  Irrigated the wounds, closed the peroneal fracture with 3-0 Caprosyn.  Care taken to avoid the superficial peroneal nerve and closed the skin with 3-0 nylon interrupted sutures, placed Steri-Strips and sterile dressing.     ASSESSMENT AND PLAN:    Weightbearing as tolerated.  Follow up in clinic in 2-3 weeks for stitch removal.  No need for DVT prophylaxis given immediate weightbearing status.  Crissie Reese, MD      Date Dictated: 10/02/2021    Date Transcribed: 10/03/2021    CPK/jf   Job #: [id number]

## 2021-10-05 ENCOUNTER — Telehealth (HOSPITAL_BASED_OUTPATIENT_CLINIC_OR_DEPARTMENT_OTHER): Payer: Self-pay | Admitting: Orthopaedic Surgery

## 2021-10-05 NOTE — Telephone Encounter (Signed)
RETURN CALL: Voicemail - General Message      SUBJECT:  General Message     MESSAGE: requesting to talk to care team after surgery information for fibula  He took a shower 3 days after surgery and wrapped off  Need more detailed instruction and he has extra wrapping that is needed putting back or not after shower   Please assist thanks

## 2021-10-06 NOTE — Telephone Encounter (Signed)
10/01/21 (Dr. Everlean Patterson): Treatment of left fibular shaft nonunion with plate and screw construct.    Patient called clinic regarding wound dressing change.  Patient advised may remove dry dressing at POD3 then change as needed if drainage is still present, otherwise can leave it open to air. Keep steri-strips in place.  May shower and rinse surgical incision but advised no soap or direct scrubbing. Pat dry completely. LLE WBAT, ROMAT.  Leg elevation and ice as needed. Tylenol for pain. Patient stated understanding and in agreement with this plan. He verified post-op appt with Team A on Friday, 10/16/21 for wound check and suture removal.

## 2021-10-06 NOTE — Telephone Encounter (Signed)
Patient is following up on the below message for after care instructions. Please call patient back ASAP. Thank you.

## 2021-10-16 ENCOUNTER — Telehealth (HOSPITAL_BASED_OUTPATIENT_CLINIC_OR_DEPARTMENT_OTHER): Payer: Self-pay

## 2021-10-16 ENCOUNTER — Encounter (HOSPITAL_BASED_OUTPATIENT_CLINIC_OR_DEPARTMENT_OTHER): Payer: No Typology Code available for payment source

## 2021-10-16 NOTE — Telephone Encounter (Signed)
LVM for patient to call the clinic back, if they can make it today before 2:30PM we can still see him, if he will be any later, we will need to reschedule to next week. If the patient is unable to come in, they can have a wound check/suture removal from their PCP/urgent care/ER.

## 2021-10-16 NOTE — Telephone Encounter (Signed)
RETURN CALL: Voicemail - Detailed Message      SUBJECT:  Cancellation/Reschedule Request     REASON: Pt overslept and missed appt this morning at 9:40am    ADDITIONAL INFORMATION:   CCR tries to reach front desk at the time of call but no answer. Pt was wondering if he was still able to come in sometime today if the care team could fit him in. States that he was needing stitches removed and is leaving town on 10/22/2021. CCR unable to appoint as pt requesting msg to be sent to team. Please advise.

## 2021-10-16 NOTE — Telephone Encounter (Signed)
RETURN CALL: Voicemail - Detailed Message      SUBJECT:  Appointment Request     REASON FOR VISIT: fractured leg   PREFERRED DATE/TIME:asap  ADDITIONAL INFORMATION: Patient returning a call from clinic

## 2021-10-21 ENCOUNTER — Encounter (HOSPITAL_BASED_OUTPATIENT_CLINIC_OR_DEPARTMENT_OTHER): Payer: Self-pay

## 2021-10-21 ENCOUNTER — Ambulatory Visit: Payer: 59 | Attending: Orthopaedic Surgery | Admitting: Unknown Physician Specialty

## 2021-10-21 VITALS — BP 125/72 | HR 83 | Temp 97.3°F

## 2021-10-21 DIAGNOSIS — Z4789 Encounter for other orthopedic aftercare: Secondary | ICD-10-CM | POA: Insufficient documentation

## 2021-10-21 DIAGNOSIS — S82422K Displaced transverse fracture of shaft of left fibula, subsequent encounter for closed fracture with nonunion: Secondary | ICD-10-CM | POA: Insufficient documentation

## 2021-10-21 NOTE — Progress Notes (Signed)
ORTHOPEDIC TRAUMA CLINIC     10/21/2021     CHIEF COMPLAINT:  3 week follow-up visit  DATE OF SURGERY: 10/21/2021   SURGICAL PROCEDURE: Left fibular shaft non-union  ATTENDING SURGEON: Kleweno    Patient was not seen with attending today.      L&I case number: Not applicable    SUBJECTIVE: Kevin Simmons presents to clinic today now roughly 3-week status post left fibular nonunion repair with Dr. Everlean Patterson.    Overall, the patient is doing well.  He denies any pain at rest, fevers, chills, night sweats, purulent/drainage/bleeding/erythema at the incision site. He is not currently taking any pain medications.    The patient states that he is roughly the same as compared to prior to surgery.  He has mild discomfort and pain with ambulation and higher impact activities such as: Jogging, running, going up and down stairs.    OBJECTIVE:  BP 125/72    Pulse 83    Temp 36.3 C (Temporal)    SpO2 100%   General: Well nourished. Well developed. Lying in bed. NAD.  Respiratory: Breathing comfortably on room air. and No extra work of breathing.   Neuro: Alert. Responding to questions appropriately    Left Lower Extremity  - Inspection: Incision is well approximated without any evidence of bleeding, purulence, drainage, erythema, or any other concerning signs.  Sutures are in place and will be removed today.  - Palpation: Notable for tenderness to palpation of the Incision site.  Nontender to palpation anywhere else throughout the left ankle/left foot.  Tibia/fibula, ankle, and foot.  - Range of Motion (Nested ROM): Painless ROM at the ankle and Toes.       - Ankle Plantarflexion: 30 degrees      - Ankle Dorsiflexion: 30 degrees  - Sensory: Sensation intact to light touch deep peroneal, superficial peroneal, tibial, sural and saphenous distributions  - Motor: Fires tibialis anterior, gastrocnemius - soleus complex, extensor hallucis longus and flexor hallucis longus against gravity.  - Vascular: warm and well perfused and  capillary refill < 2s  - Compartments: Compartments of the leg are soft and compressible. Compartments of the thigh are soft and compressible.    IMAGING:  No new imaging obtained today.    ASSESSMENT:   Kevin Simmons is a 20 year old male who is roughly 3 weeks status post left fibular nonunion repair.    Overall, the patient is doing well and is continue to recover from surgery.    Will continue to monitor him clinically to see if his symptoms improve (pain with higher impact activities, such as running, going up and down stairs, etc.).    PLAN:  - Return to clinic in 4 weeks for 6-week follow-up visit.  (Already scheduled for 11/18/2021).  - Left lower extremity: Weightbearing as tolerated, range of motion as already.    All other questions were answered to patients apparent satisfaction.     Dwana Curd, MD      Orders Placed This Encounter    Suture Removal     Standing Status:   Future     Standing Expiration Date:   10/21/2022     Order Specific Question:   Release Result to Patient     Answer:   Immediate

## 2021-10-21 NOTE — Progress Notes (Signed)
Suture Removal    Date/Time: 10/21/2021 2:03 PM  Performed by: Milagros Reap, Technologist  Authorized by: Crissie Reese, MD     Consent:     Consent obtained:  Verbal    Consent given by:  Patient    Risks discussed:  Bleeding, pain and wound separation  Universal protocol:     Patient identity confirmed:  Verbally with patient  Location:     Location:  Lower extremity (Left)  Procedure details:     Wound appearance:  No signs of infection, good wound healing, clean, nonpurulent and nontender  Post-procedure details:     Post-removal:  Steri-Strips applied    Procedure completion:  Tolerated well, no immediate complications

## 2021-10-22 NOTE — Progress Notes (Signed)
I, Jovante Chanze Teagle, did not see the patient, but have reviewed the findings.

## 2021-11-18 ENCOUNTER — Ambulatory Visit (HOSPITAL_BASED_OUTPATIENT_CLINIC_OR_DEPARTMENT_OTHER): Payer: No Typology Code available for payment source | Admitting: Registered Nurse

## 2021-11-18 ENCOUNTER — Ambulatory Visit
Admission: RE | Admit: 2021-11-18 | Discharge: 2021-11-18 | Disposition: A | Discharge status: Home / Self Care | Payer: No Typology Code available for payment source | Source: Ambulatory Visit | Attending: Diagnostic Radiology | Admitting: Diagnostic Radiology

## 2021-11-18 ENCOUNTER — Encounter (HOSPITAL_BASED_OUTPATIENT_CLINIC_OR_DEPARTMENT_OTHER): Payer: Self-pay | Admitting: Orthopaedic Surgery

## 2021-11-18 VITALS — BP 126/78 | HR 84 | Temp 97.2°F | Resp 18 | Ht 70.0 in | Wt 152.0 lb

## 2021-11-18 DIAGNOSIS — S82202D Unspecified fracture of shaft of left tibia, subsequent encounter for closed fracture with routine healing: Secondary | ICD-10-CM | POA: Insufficient documentation

## 2021-11-18 DIAGNOSIS — S82422K Displaced transverse fracture of shaft of left fibula, subsequent encounter for closed fracture with nonunion: Secondary | ICD-10-CM | POA: Insufficient documentation

## 2021-11-18 NOTE — Patient Instructions (Signed)
Good to see you today.     Precautions: walk on your leg as you feel comfortable.   Exercises: may do wall sits to work on gentle strengthening on your quads. Continue to stretch ankle      Pain management:   Recommend 1000mg  Tylenol as needed for pain    Densensitization Techniques:   Try using soft fabric and rubbing it over your foot and then use a more coarse fabric (like a toothbrush) and do the same. Do not cause a wound obviously.     Good thing I asked about nicotine. Many people say no to smoking on our questionnaire. It is shown to slow bone growth some. Try to stop or at minimum reduce the amount of vaping you do until we see you next.     PT at [facility] will call you to get scheduled. You may also call the contact center if you dont hear from them in the next week    Follow-up:  Please schedule/ return for a follow-up visit 6 weeks    Contact the clinic if concerning new symptoms    [facility], ARNP  [phone] , option 2. Nurse Line for any questions or concerns

## 2021-11-18 NOTE — Progress Notes (Signed)
11/18/21    L&I no    Surgeon: Dr. 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    HPI:  Patient is a 20 year old male presenting 6 weeks status post the above procedure. Had index surgery for left tibia IMN 1 year ago at OSH. Reports having 2/10 aching pain in the left lateral leg. Is ambulating without the use of an assistive device. Pain is aggravated by prolonged walking. 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 He has not taken Gabapentin in the past for this. He is not in formal pt at this time. Denies calf pain, tenderness. Denies any new numbness, tingling, or paresthesia. He has a birthday coming up.     Physical Exam:   GENERAL: Well-developed, no acute distress  Pulmonary: respirations even and unlabored  VS:   Vitals:    11/18/21 0937   BP: 126/78   BP Cuff Size: Large   BP Site: Right Arm   BP Position: Sitting   Pulse: 84   Resp: 18   Temp: 36.2 C   TempSrc: Temporal   SpO2: 98%   Weight: 68.9 kg (152 lb)   Height: 5\' 10"  (1.778 m)     MUSCULOSKELETAL: left lower extremity   Inspection: Incisions well healed, no drainage or surrounding erythema.  Trace edema noted on inspection.   Palpation/Tenderness: no calf tenderness to palpation   ROM: ankle dorsiflexion to 10, plantarflexion to 30.  Motor: Quadriceps, hamstrings, gastrocsoleus complex, tibialis anterior, EHL, and FHL intact.  Sensation: Grossly intact to light touch along tibial, sural, saphenous, deep and superficial peroneal nerve distributions.   Vascular: Warm and well-perfused, dorsalis pedis and posterior tibial pulse 2+.    Imaging:  X-rays of left tibia fibula demonstrate evidence of interval healing. Hardware is intact. No signs of loss of reduction or change in alignment compared to previous x-rays. Films reviewed with Dr.    Impression:   Patient is a 21 year old  male presenting for follow up 6 weeks status post ORIF left fibular shaft fracture by Dr. 21. Radiographs today demonstrate interval healing and no hardware complications. Orla has improving neuropathic symptoms now described as hypersensitivity. His sensation is intact to light touch and he has no motor deficits. He may continue to weightbear as tolerated on the left lower extremity. Recommend continuing to elevate as needed to reduce swelling and neuropathic pain. Discussed densensitization techniques for his neuropathic pain. A PT referral was placed today. Advised he may begin doing wall sits at home.   In discussion, Hollie reports he uses nicotine in vaping form. Recommend he reduce the amount of nicotine over the next several weeks, with cessation being the top recommendation. We will see him back in 6 weeks for follow up and repeat xrays. Walfred is in agreement with this plan.     Plan:   - Precautions: LLE WBAT, ROMAT  - ROM and Elevation LLE as needed  - Physical therapy: referral placed today  - May take tylenol as needed for pain.     RTC in 6 weeks for follow up and x-rays of left tibia fibula, 2 views.    Everlean Patterson, DNP, ARNP  Department of Orthopaedic Trauma  [facility]

## 2021-11-30 ENCOUNTER — Encounter (HOSPITAL_BASED_OUTPATIENT_CLINIC_OR_DEPARTMENT_OTHER): Payer: No Typology Code available for payment source | Admitting: Rehabilitative and Restorative Service Providers"

## 2021-11-30 NOTE — Progress Notes (Deleted)
PHYSICAL THERAPY INITIAL PLAN OF CARE          PLAN OF CARE DUE: 01/29/22  Progress note due: 12/30/21     L&I Claim Number:  NA    INTERPRETER  STATUS (Not needed, Telephonic, In Person):not needed   Referring Provider:  Nichole S Torrado   Diagnosis: There were no encounter diagnoses.    PRECAUTIONS:    11/18/2021:  LLE WBAT, ROMAT    Pertinent Medical/Surgical History/Current Medications that may affect progress in PT:   Patient Active Problem List   Diagnosis   . 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 Fracture       Past Surgical History:   Procedure Laterality Date   . IM Nail Tib/Fib Left    . TOTAL SHOULDER ARTHROPLASTY         ____________________________________________________________________     Previous Therapy: ***    Prior Level of Function:  Before onset of the current condition, the patient was able to ***      Home Environment/ADLs: ***  Stairs: ***  Employment:  ***Currently unemployed, ***Current job is     21 year old male. Reason for Referral: s/p treatment of non-union left fibula shaft fracture on 10/01/21.    Patient to work on gait training, balance and proprioception, stabilization exercises, and active, active assisted, and passive range of motion exercises of the left lower extremity, gentle progressive strengthening exercises of quadriceps, hamstrings, abductors, hip flexors, and gluteal muscles. Edema control and other modalities as needed.     SUBJECTIVE:  Patient's Statement: Patient presenting s/p surgical repair for left fibular shaft nonunion with plate and screw construct on 10/21/2021.    Original injury in January 2022, at which time Patient was playing rugby, sustaining Left tibial and fibular shaft fractures following a tackle. Patient was treated with an intramedullary nail of the tibia and the fibula was treated non-operatively.     Progression of symptoms: ***    Exams to Date: ***    Patient's  prior activity level: ***    Patient's current activity level: ***    Patiens Goals: ***    Knee Special Questions  Swelling: {Yes,_No_Unkown:109867::"No"}  Locking: {Yes,_No_Unkown:109867::"No"}  Clicking/Catching: {Yes,_No_Unkown:109867::"No"}  Buckles/Gives way: {Yes,_No_Unkown:109867::"No"}  Grinds/Grates: {Yes,_No_Unkown:109867::"No"}  Pop and swelling following injury: {Yes,_No_Unkown:109867::"No"}      OBJECTIVE MEASURES / IMPAIRMENTS / ACTIVITY LIMITATIONS:  Pain Scale (0-10 scale)  Location of Pain:   Worse:  {PAIN SCALE:104448::"0 (No Pain)"}, Current: {PAIN SCALE:104448::"0 (No Pain)"}    What makes symptom(s) worse? {ACTIVITY:100452}    What improves your symptom(s)? {improve:106326}    Posture/Observation: ***  {KNEEalignment:104861}    Gait: ***    TUG/Balance:    Timed Up and Go Test time (seconds): ***  10 Second Standing Balance:    Narrow Base: ***   Semi Tandem: ***   Tandem: ***  Single leg balance: ***  4 Square step test (seconds): ***  Functional Reach (inches): ***    Functional tests/Stairs:    Single Leg Balance: ***  Squat - 2 Leg: ***  Squat - Single Leg: ***  Balance Reach: ***  Single Leg Heel Raises: ***  Flight of Stairs: ***  Step Ups Anterior: ***  Step Downs Anterior: ***    KNEE ROM:      Right  Left    AROM Flexion: ***  Ext: ***   Flexion: ***  Ext: ***   PROM Flexion: ***  Ext: *** Flexion: ***  Ext: ***         LOWER EXTREMITY STRENGTH:   Strength (out of 5)   Left Right   Psoas     Quadriceps     Hamstrings          Gluteus Medius     Gluteus Maximus     IR     ER          Dorsiflexion     Plantarflexion     Inversion     Eversion         Lower extremity muscle length:  Hamstring theta angle:     Right:***, Left:***  Quadriceps length assessed in prone, with knee flexion:  Right:***, Left:***  Piriformis length assessed in prone:     Right:***, Left:***    Neuro Exam:  Motor Score: {LE motor score:500251319::"Key muscles tested from L2 - S2 are within normal limits"}  Light  touch Sensation:{LE Sensation Light Touch:500251322::"Sensation to light touch from L2 - S2 is intact"}  Deep Tendon Reflexes: {Neuro LE Reflexes DT:500251324::"Patella and Achilles Deep Tendon Reflexes are within normal limits"}  Pathological Reflexes: {reflexes pathological:500251077}    Clearing Exams:   Ankle: ***  Exam does not reproduce current symptoms.    Hip: ***  Exam does not reproduce current symptoms.      Knee Special Tests:  Valgus Stress at 0 degrees: ***  Valgus Stress at 30 degrees: ***  Varus Stress at 0 degrees: ***  Varus Stress at 30 degrees: ***  Lachman's Test: ***  Pivot Shift: ***  Anterior Drawer: ***  Posterior Drawer: ***  Posterior Sag Sign: ***  McMurray's Test: ***  Thessaly's Test: ***  Apley's Compression Test: ***  Apley's Distraction Test: ***  Patellar Apprehension: ***  Patellar Grind: ***  Extensor Lag: ***    Joint Mobility:    Patellar glides superior/inferior: ***  Patellar glides medial/lateral:  ***    Palpation:   +TTP:  ***       Meniscus CPR: (if indicated)   1. History of catching or locking reported by the patient  2. Joint line tenderness  3. Pain with forced hyperextension (modified bounce home test)  4. Pain with maximal passive knee flexion  5. Pain or audible click with McMurray maneuver    MCL CPR: (if indicated)   1. Trauma by external force to leg  2. Rotational trauma  3. Pain with valgus stress test at 30   4. Laxity with valgus stress test at 30     [place] Knee Rules:   1. Age > 55  2. Tenderness at the head of the fibula  3. Isolated tenderness of the patella during palpation  4. Inability to flex the knee to 90 degrees  5. Inability to bear weight immediately and upon ER evaluation    [place] Knee Rules:   Blunt trauma or a fall as mechanism of injury PLUS either of the following:  1. Age older than 41 years or Younger than 12 years  2. Inability to walk 4 weight-bearing steps in the emergency department    TREATMENT & EDUCATION    Primary Learner:  {Primary Learner:500251378}  Topics Taught: {Topics Taught:500251385}  Challenges Impacting this Teaching: {LEARNING CHALLENGES:106384}  Desire and Motivation to Learn: {LEARNING OVZCHY:850277}  Preferred Learning Style: {PREFERRED LEARNING AJOIN:867672}  Post Education Response:  {POST ED CNOBSJGG:836629}      Patient's learning needs,  abilities, preferences and readiness per Initial POC were considered in this session.  Learning verified by teach back.      Evaluation Code: {EVAL CODE:102991::"97161","97162","97163"} for *** minutes.  Clinical Presentation for Selection of Evaluation Code: {CLINICAL PRESENTATION:102991::"Stable","Evolving","Unstable"}  Clinical Decision Making Complexity for Selection of Eval Code:{CLINICAL DECISION MAKING:102991::"Low","Moderate","High"}    Intervention:  Intervention:  {PT INTERVENTIONS/SKILLED SERVICES:500251371}                       Intervention:  {PT INTERVENTIONS/SKILLED SERVICES:500251371}         Intervention:  {PT INTERVENTIONS/SKILLED SERVICES:500251371}               Time in:   ***  Total Treatment Minutes:   ***   Total Timed Code Minutes:  ***    Home Exercise Program   ***    {PT EDUCATION ([place]/ETC):105852}    ASSESSMENT:    Kevin Simmons is a 21 year old male who presents to Physical Therapy  ***.  Signs/symptoms consistent with ***.      Patient will benefit from continued treatment aimed at further assist with symptom-reduction, home exercise program progression and functional mobility training, in order to work toward their functional goals.       Factors/barriers that may delay or affect course of care include: pertinent past medical history as listed above,  {potential barriers:500251132}, ***    Outcomes Score: LEFS: ***       FALL RISK:    {NO OR DETAIL:107424::"low","high"} fall risk based on screening      ***Patient is observed to be competent and safe walking on level surfaces and stairs with assistive devices. Based on screening, patient is  considered a minimal fall risk at this time.      PLAN:   Pain will be addressed at the next plan of care review, and intermittently during daily visits as deemed appropriate by the provider.  The focus of daily visits will be on function.    Therapy Treatment Plan (Frequency/Duration/Other Follow up): Patient will be seen for {Patient WYOV:785885027}  until plan of care reviewed.    Plan for next visit:  ***    To improve impairments and reach functional goals the following interventions will be performed: {interventions:500251383}      TREATMENT GOALS  Goals and Current Level of Function/ Established/Updated on 11/30/2021  Short Term Goals to be Met on 12/30/21  Long Term Goals (Functional Goals) Anticipated Discharge Date 02/28/22    DISCHARGE GOALS   (Functional Goals)   Anticipated Discharge Date 02/28/22 Current Level of Function /  Participation Restrictions   1. Patient to be symptom-free with walking community distances, ie for up to 30 min at a time. Increased pain with walking.   2. Patient to be symptom-free with going up/down 1 flight of stairs, reciprocal pattern, and up/down a 1 block hill. Increased pain with going up/down hills/stairs.        Short Term Goals MET or UNMET and progress New Short Term Goals   Patient to demonstrate 5/5 proximal hip strength in order to improve mechanics with gait.      ***     ***           Rehabilitation potential is: {OT REHAB POTEN ([place]/ETC):105830::"Excellent"}      Patient assisted in establishing goals and {agreement statement:500251095}    Cultural Practices that Influence Care: {CULTURAL PRACTICES INFLUENCING CARE:105816::"none"}    Pattricia Boss, PT  [facility]  [facility]  Phone: [phone]   FAX: [phone]

## 2021-12-29 ENCOUNTER — Ambulatory Visit
Payer: No Typology Code available for payment source | Attending: Registered Nurse | Admitting: Rehabilitative and Restorative Service Providers"

## 2021-12-29 ENCOUNTER — Encounter (HOSPITAL_BASED_OUTPATIENT_CLINIC_OR_DEPARTMENT_OTHER): Payer: Self-pay

## 2021-12-29 DIAGNOSIS — R262 Difficulty in walking, not elsewhere classified: Secondary | ICD-10-CM

## 2021-12-29 DIAGNOSIS — S82422K Displaced transverse fracture of shaft of left fibula, subsequent encounter for closed fracture with nonunion: Secondary | ICD-10-CM

## 2021-12-29 NOTE — Progress Notes (Signed)
PHYSICAL THERAPY INITIAL PLAN OF CARE          VISITS FROM SOC: 1     PLAN OF CARE DUE: 02/27/22  Progress note due: 01/28/22     INTERPRETER  STATUS: not needed     Referring Provider:  Evern Core    [x]   Internal or  []   External Provider  Referral 11/18/2021: 21 year old male. Reason for Referral: s/p treatment of non-union left fibula shaft fracture on 10/01/21   L&I Claim Number:  None     Diagnosis: closed displaced transverse fracture of shaft of (L) fibula with non union   PRECAUTIONS:  11/18/2021: LLE WBAT, ROMAT  Mechanism of Injury: initial injury was playing rugby s/p surgery. Followed by secondary surgery  s/p treatment of non-union left fibula shaft fracture on 10/01/21     Pertinent Medical/Surgical History that may affect progress in Physical Therapy:     Patient Active Problem List   Diagnosis   . 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 Fracture line is is still visible. No hardware complication.  Redemonstration of intramedullary rod with proximal and distal locking screws transfixing distal tibial diaphyseal fracture with appropriate progress towards healing. No hardware complication.      Prior Level of Function:  Rugby, gym (weigh training)     Occupation: Tourist information centre manager, Gaffer. Sitting at a computer.   Walk or scooter to work     Home Environment: apartment, Media planner.     Chief Complaint: (L) leg pain    Patient's Goals: back to playing sports. Wants to return to rugby     SUBJECTIVE   Subjective   Patient's Statement: had second surgery, feeling OK, delayed on PHYSICAL THERAPY.     Has been walking  about but not a lot of exercise     Location/ Description of Chief complaint: all throughout LE (L), sometimes (R) leg can hurt.   Less concentrated pain after second surgery in bones.    Foot can be numb, gradually getting better but not  100%. Sometimes extra sensitive.     What makes symptom(s) worse? Standing or walking for prolonged periods, more time on feet.     What improves symptom(s)? Off of feet/ sit and rest.      HPI per chart review: 11/18/2021: Patient is a 21 year old male presenting 6 weeks status post the above procedure. Had index surgery for left tibia IMN 1 year ago at OSH. Reports having 2/10 aching pain in the left lateral leg. Is ambulating without the use of an assistive device. Pain is aggravated by prolonged walking. 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 He has not taken Gabapentin in the past for this. He is not in formal pt at this time. Denies calf pain, tenderness. Denies any new numbness, tingling, or paresthesia.      [place] has improving neuropathic symptoms now described as hypersensitivity. His sensation is intact to light touch and he has no motor deficits. He may continue to weightbear as tolerated on the left lower extremity. Recommend continuing to elevate as needed to reduce swelling and neuropathic pain. Discussed densensitization techniques for his neuropathic pain. A PT referral was placed today. Advised he may begin doing wall sits at  home.          OBJECTIVE MEASURES / IMPAIRMENTS / ACTIVITY LIMITATIONS:   Objective   Pain: 0-2/10    Gait: arrives today in tennis shoes, no AD. Gait non antalgic. Symmetrical gait pattern.     Edema: none   Sensation to light touch: hypersensitive to light touch   Circulation: well perfused  Scar mobility: good mobility on distal incision about tibiofemoral joint and good mobility along lateral fibular incision.     Ankle/Foot ROM  (R) ankle/foot  Dorsiflexion with knee extended: -15 degrees  Dorsiflexion with knee flexed: -10 degrees  Plantarflexion: 55 degrees  Forefoot eversion: 20 degrees  Forefoot inversion: 30 degrees  1st MTP dorsiflexion: 80 degrees  1st MTP  plantarflexion: 20 degrees    (L) ankle/foot  Dorsiflexion with knee extended: -25 degrees  Dorsiflexion with knee flexed: -12 degrees  Plantarflexion: 45 degrees  Forefoot eversion: 15 degrees  Forefoot inversion: 30 degrees  1st MTP dorsiflexion: 80 degrees  1st MTP plantarflexion: 20 degrees    Knee AROM   (L): 0 to 130 Degrees   (R): 0 to 135 Degrees      Ankle/Foot Strength  Tibialis Anterior: 5 (R), 5 (L)  Tibialis Posterior: 5 (R), 5 (L)  Fibularis Longus: 5 (R), 4 (L)  Fibularis  Brevis: 5 (R), 4 (L)  Gastrocnemius (non-weight-bearing): 5 (R), 5 (L)  Gastrocnemius (weightbearing single leg calf raises): 17 (R), 9 (L)  + symptom(s)   Hallux Extension: 5 (B)   Hallux Flexion: 5 (B)   Lesser Toes Flexion / Extension: 5 (B)      Ankle Mobility  Talocrural joint: hypomobile   Subtalar joint: hypomobile   1st MTP joint:  Mobile     Functional Strength :  SL balance: 15+ sec without difficulty  Squat: decrease anterior tibial translation (B), excessive trunk & hip flexion. Knee flexion < 90 Degrees       TREATMENT & EDUCATION      Intervention 1:   97110 Therapeutic exercise for 10 minutes       HOME EXERCISE PROGRAM  Desensitize training x 5 minutes using hands/ variety of different textures  SL heel raise 5 x 5   SLR gastroc stretch with strap     verbal instructions and demonstrations were provided for the above exercises  a handout was provided describing the exercises in written and picture format    Time in:   1144  Total Treatment Minutes:   34   Total Timed Code Minutes:  Brecon is a 21 year old male who presents to physical therapy with history of a left tibia and fibular fracture sustained while playing rugby and patient underwent intramedullary nail of the tibia and the fibula was treated nonoperatively cleared 11/2020. Patient was noted to have nonunion of the fibula and subsequently underwent ORIF of fibula 10/01/2021. Latest imaging reveals appropriate progress  towards healing.   Signs and symptoms are consistent with post-operative state. Patient demonstrates ankle stiffness, gastroc equinus, and generalized lower extremity weakness. Patient would benefit from skilled physical therapy to focus on restoring ankle mobility, lower extremity strengthening, and further musculoskeletal testing & treatment of proximal lower extremity in order to and achieve functional goals below.  Patient's primary goal is to return to recreationaly rugby.    PHYSICAL THERAPY GOALS     Goals and Current Level of Function/ Established/Updated on  12/29/2021  Long Term Goals (Functional Goals) Anticipated Discharge Date 03/29/22  . Patient to report ability to perform community distance ambulation without significant pain or difficulty by discharge        Current Level of Function/ Participation Restrictions: with generalized LE symptom(s) following prolonged walking/ standing/ time on feet.   . Patient to report ability to return to recreation activity without significant difficulty by discharge        Current Level of Function/ Participation Restrictions: unable to return to rugby     Short Term Goals to address Activity Limitations.  Short Term Goals to be Met on 01/28/22  . Patient to demonstrate -10 degrees of ankle DF with knee extended (L) to facilitate normal gait mechanics.    . Pt to demonstrate 15 SL heel raises to full height (L)  for normal gait mechanics.   . Pt to demonstrate squat > 90 Degrees hip and knee flexion for return to gym physical activity.   . Patient to be indep with HEP 4 days a week as needed for self-management of symptoms       PLAN     Plan   . Therapy Treatment Plan (Frequency/Duration/Other Follow up): Patient will be seen for 1 visit per week  until plan of care reviewed.  . To improve impairments and reach functional goals the following interventions will be performed: Patient Education, Manual Therapy (67591), Therapeutic Exercise [phone]), Therapeutic Activities  [phone]), Neuromuscular Re-education [phone]) and Gait Training [phone])  . Patient assisted in establishing goals and agrees with plan  . Patient's learning needs, abilities, preferences and readiness per Initial POC were considered in this session. Learning verified by teach back.  . Pain will be addressed at the next plan of care review, and intermittently during daily visits as deemed appropriate by the provider.  The focus of daily visits will be on function.    PLAN FOR NEXT VISIT(S): hip/ knee assessment and treatment PRN  Work on LE strengthening, goal is to return to rugby.     DOCUMENTATION / INSURANCE  REQUIREMENTS     EVAL CODE:  Evaluation Code: [id number] for 24 minutes  Clinical Presentation for Selection of Evaluation Code: Evolving  Clinical Decision Making Complexity for Selection of Evaluation Code: Moderate  Eval Code Reasoning & Potential Barriers: Past Injuries/Surgeries and recovery reliant on bony healing  Eval complexity selection based on the Objective Findings, Co-morbidity Assessment, Psychosocial Factors and Potential Barriers.    Prognosis and Factors that will impact course of care:  . Prognosis for meeting outline goals: excelelnt  . The following cultural factors were taken into consideration: none    Outcomes Score: FAAM  [x]   See scanned document at Evaluation []   Not completed by patient    FALL RISK:   [x]   low []   mod []   high fall risk based on screening       Education Assessment:  . Primary Learner: Patient  . Challenges Impacting this Teaching: None  . Desire and Motivation to Learn: Engaging with education, ask questions  . Preferred Learning Style: demonstration and written  . Post Education Response:  Demonstrates tasks independently  . Topics Taught: role of physical therapy, education on diagnosis and prognosis, purpose of interventions, and initial HEP instructions         Donne Anon, PT, DPT, OCS  [facility] [place]  Phone: [phone]   FAX: [phone]

## 2021-12-29 NOTE — Patient Instructions (Addendum)
Desensitization training: touch your foot with your hands and a variety of other surfaces/ textures to train your nerves on "normal sensations" . Perform for a total of 5 minutes per day while making eye contact with your foot.

## 2022-01-06 ENCOUNTER — Encounter (HOSPITAL_BASED_OUTPATIENT_CLINIC_OR_DEPARTMENT_OTHER): Payer: No Typology Code available for payment source | Admitting: Rehabilitative and Restorative Service Providers"

## 2022-01-06 NOTE — Progress Notes (Deleted)
PHYSICAL THERAPY TREATMENT NOTE      VISITS FROM SOC: Visit count could not be calculated. Make sure you are using a visit which is associated with an episode.    PLAN OF CARE DUE: 02/27/22  Progress note due: 01/28/22     INTERPRETER  STATUS: not needed     Referring Provider:  Nichole S Torrado    [x]?  Internal or  []?  External Provider  Referral 11/18/2021: 21 year old male. Reason for Referral: s/p treatment of non-union left fibula shaft fracture on 10/01/21   L&I Claim Number:  None     Diagnosis: closed displaced transverse fracture of shaft of (L) fibula with non union   PRECAUTIONS:  11/18/2021: LLE WBAT, ROMAT  Mechanism of Injury: initial injury was playing rugby s/p surgery. Followed by secondary surgery  s/p treatment of non-union left fibula shaft fracture on 10/01/21     Pertinent Medical/Surgical History that may affect progress in Physical Therapy:         Patient Active Problem List   Diagnosis   . 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 Fracture line is is still visible. No hardware complication.  Redemonstration of intramedullary rod with proximal and distal locking screws transfixing distal tibial diaphyseal fracture with appropriate progress towards healing. No hardware complication.      Prior Level of Function:  Rugby, gym (weigh training)     Occupation: [employer], software developer. Sitting at a computer.   Walk or scooter to work     Home Environment: apartment, elevator.     Chief Complaint: (L) leg pain    Patient's Goals: back to playing sports. Wants to return to rugby       SUBJECTIVE   Subjective   Patient states: ***      OBJECTIVE MEASURES / IMPAIRMENTS / ACTIVITY LIMITATIONS:   Objective   Pain: ***/10    Hip Mobility    LE strength  Quad:   Hamstring    Proximal Hip strength  Iliopsoas:   Gluteus medius:   Gluteus maximus:    DLR:   MR:     Muscle Length  Quad (prone knee flexion):   Hamstring (theta):     TREATMENT & EDUCATION      Intervention 1:   {PT INTERVENTIONS/SKILLED SERVICES:500251371}       hip/ knee assessment and treatment PRN  Work on LE strengthening, goal is to return to rugby.     Intervention 2:   {PT INTERVENTIONS/SKILLED SERVICES:500251371}           HOME EXERCISE PROGRAM  Desensitize training x 5 minutes using hands/ variety of different textures  SL heel raise 5 x 5   SLR gastroc stretch with strap     {PT EDUCATION ([place]/ETC):105852}    Time in:   ***  Total Treatment Minutes:   ***   Total Timed Code Minutes:  ***          ASSESSMENT/ PATIENT RESPONSE TO TREATMENT   Assessment   ***        PLAN     Plan   ***       Jovaun Levene, PT, DPT, OCS  [facility]  [facility]  Phone: [phone]   FAX: [phone]

## 2022-01-11 NOTE — Progress Notes (Unsigned)
PHYSICAL THERAPY TREATMENT NOTE      VISITS FROM SOC: Visit count could not be calculated. Make sure you are using a visit which is associated with an episode.    PLAN OF CARE DUE: 02/27/22  Progress note due: 01/28/22     INTERPRETER  STATUS: not needed     Referring Provider:  Reina Fuse Torrado    [x] ?  Internal or  [] ?  External Provider  Referral 11/18/2021: 21 year old male. Reason for Referral: s/p treatment of non-union left fibula shaft fracture on 10/01/21   L&I Claim Number:  None     Diagnosis: closed displaced transverse fracture of shaft of (L) fibula with non union   PRECAUTIONS:  11/18/2021: LLE WBAT, ROMAT  Mechanism of Injury: initial injury was playing rugby s/p surgery. Followed by secondary surgery  s/p treatment of non-union left fibula shaft fracture on 10/01/21     Pertinent Medical/Surgical History that may affect progress in Physical Therapy:         Patient Active Problem List   Diagnosis   . 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 Fracture line is is still visible. No hardware complication.  Redemonstration of intramedullary rod with proximal and distal locking screws transfixing distal tibial diaphyseal fracture with appropriate progress towards healing. No hardware complication.      Prior Level of Function:  Rugby, gym (weigh training)     Occupation: Tourist information centre manager, Gaffer. Sitting at a computer.   Walk or scooter to work     Home Environment: apartment, Media planner.     Chief Complaint: (L) leg pain    Patient's Goals: back to playing sports. Wants to return to rugby       SUBJECTIVE   Subjective   Patient states: ***      OBJECTIVE MEASURES / IMPAIRMENTS / ACTIVITY LIMITATIONS:   Objective   Pain: ***/10    Hip Mobility    LE strength  Quad:   Hamstring    Proximal Hip strength  Iliopsoas:   Gluteus medius:   Gluteus maximus:    DLR:   MR:     Muscle Length  Quad (prone knee flexion):   Hamstring (theta):     TREATMENT & EDUCATION      Intervention 1:   {PT INTERVENTIONS/SKILLED SERVICES:500251371}       hip/ knee assessment and treatment PRN  Work on LE strengthening, goal is to return to rugby.     Intervention 2:   {PT INTERVENTIONS/SKILLED SERVICES:500251371}           HOME EXERCISE PROGRAM  Desensitize training x 5 minutes using hands/ variety of different textures  SL heel raise 5 x 5   SLR gastroc stretch with strap     {PT EDUCATION ([place]/ETC):105852}    Time in:   ***  Total Treatment Minutes:   ***   Total Timed Code Minutes:  ***          ASSESSMENT/ PATIENT RESPONSE TO TREATMENT   Assessment   ***        PLAN     Plan   ***       Donne Anon, PT, DPT, OCS  [facility]  Phone: [phone]   FAX: [phone]

## 2022-01-13 ENCOUNTER — Ambulatory Visit
Payer: No Typology Code available for payment source | Attending: Registered Nurse | Admitting: Rehabilitative and Restorative Service Providers"

## 2022-01-13 ENCOUNTER — Telehealth (HOSPITAL_BASED_OUTPATIENT_CLINIC_OR_DEPARTMENT_OTHER): Payer: Self-pay | Admitting: Orthopaedic Surgery

## 2022-01-13 DIAGNOSIS — R262 Difficulty in walking, not elsewhere classified: Secondary | ICD-10-CM | POA: Insufficient documentation

## 2022-01-13 DIAGNOSIS — S82422K Displaced transverse fracture of shaft of left fibula, subsequent encounter for closed fracture with nonunion: Secondary | ICD-10-CM | POA: Insufficient documentation

## 2022-01-13 NOTE — Telephone Encounter (Signed)
RETURN CALL: Voicemail - Detailed Message      SUBJECT:  Appointment Request     REASON FOR VISIT: Patient was advised by physical therapist to set up appointment for post op from surgery     PREFERRED DATE/TIME:     ADDITIONAL INFORMATION: patient had surgery over 90 days ago, followed return- no available appointments. Please reach out to patient

## 2022-01-18 NOTE — Telephone Encounter (Signed)
LVM for pt to call back to schedule apt    SCHEDULE: POST-OP w/ KLEWENO 4/5

## 2022-01-18 NOTE — Telephone Encounter (Signed)
Able to reach pt and sch apt 4/5

## 2022-01-19 NOTE — Progress Notes (Unsigned)
PHYSICAL THERAPY TREATMENT NOTE      VISITS FROM SOC: Visit count could not be calculated. Make sure you are using a visit which is associated with an episode.    PLAN OF CARE DUE: 02/27/22  Progress note due: 01/28/22     INTERPRETER STATUS: not needed    Referring Provider: Damita Dunnings Torrado   [x] ??Internal or [] ??External Provider  Referral01/18/2023:21 year old male. Reason for Referral: s/p treatment of non-union left fibula shaft fracture on 10/01/21  L&I Claim Number: None    Diagnosis:closed displaced transverse fracture of shaft of (L) fibula with non union  PRECAUTIONS:11/18/2021:LLE WBAT, ROMAT  Mechanism of Injury:initial injury was playing rugby s/p surgery. Followed by secondary surgerys/p treatment of non-union left fibula shaft fracture on 10/01/21    Pertinent Medical/Surgical History that may affect progress in Physical Therapy:      Patient Active Problem List   Diagnosis   . 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 Fracture line is is still visible. No hardware complication.  Redemonstration of intramedullary rod with proximal and distal locking screws transfixing distal tibial diaphyseal fracture with appropriate progress towards healing. No hardware complication.     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 Sitting at a computer.   Walk or scooter to work    Home Environment:apartment, 11/20/2021.    Chief Complaint:(L) leg pain  Patient's Goals:back to playing sports. Wants to return to rugby        SUBJECTIVE   Subjective   Patient states: ***      OBJECTIVE MEASURES / IMPAIRMENTS / ACTIVITY LIMITATIONS:   Objective   Pain: ***/10      TREATMENT & EDUCATION      Intervention 1:   {PT INTERVENTIONS/SKILLED SERVICES:500251371}       Strength training,  hip extension mobility  Gym routine, BFR if/ when cleared by ortho trauma     Squat   Squat with weight   deadlift with weight     Leg press     Hip extension mobility/ stretch     Intervention 2:   {PT INTERVENTIONS/SKILLED SERVICES:500251371}       Talar posterior glide (L) with distraction      HOME EXERCISE PROGRAM  Desensitize training x 5 minutesusing hands/ variety of different textures  SL heel raise 3 x 10   SLR gastroc stretch with strap    01/13/2022:   SL hip abduction with knee extension   Squat with blue band - cues to allow hip and knee flexion (knee towards toes OK)     {PT EDUCATION (/ETC):105852}    Time in:   ***  Total Treatment Minutes:   ***   Total Timed Code Minutes:  ***          ASSESSMENT/ PATIENT RESPONSE TO TREATMENT   Assessment   ***        PLAN     Plan   ***  Hip ext mobility/ stretch  Lunges   Stairs     Frontal plane        Engineer, structural, PT, DPT, OCS  [facility]  [facility]  Phone: [phone]   FAX: [phone]

## 2022-01-20 ENCOUNTER — Ambulatory Visit (HOSPITAL_BASED_OUTPATIENT_CLINIC_OR_DEPARTMENT_OTHER): Payer: No Typology Code available for payment source | Admitting: Rehabilitative and Restorative Service Providers"

## 2022-01-20 DIAGNOSIS — R262 Difficulty in walking, not elsewhere classified: Secondary | ICD-10-CM

## 2022-01-20 DIAGNOSIS — S82422K Displaced transverse fracture of shaft of left fibula, subsequent encounter for closed fracture with nonunion: Secondary | ICD-10-CM

## 2022-01-26 NOTE — Progress Notes (Signed)
PHYSICAL THERAPY TREATMENT NOTE      VISITS FROM SOC: 4    PLAN OF CARE DUE: 02/27/22  Progress note due: 01/28/22     INTERPRETER STATUS: not needed    Referring Provider: Reina Fuse Torrado   [x] ???Internal or [] ???External Provider  Referral01/18/2023:21 year old male. Reason for Referral: s/p treatment of non-union left fibula shaft fracture on 10/01/21  L&I Claim Number: None    Diagnosis:closed displaced transverse fracture of shaft of (L) fibula with non union  PRECAUTIONS:11/18/2021:LLE WBAT, ROMAT  Mechanism of Injury:initial injury was playing rugby s/p surgery. Followed by secondary surgerys/p treatment of non-union left fibula shaft fracture on 10/01/21    Pertinent Medical/Surgical History that may affect progress in Physical Therapy:      Patient Active Problem List   Diagnosis   . 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 Fracture line is is still visible. No hardware complication.  Redemonstration of intramedullary rod with proximal and distal locking screws transfixing distal tibial diaphyseal fracture with appropriate progress towards healing. No hardware complication.     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 Sitting at a computer.   Walk or scooter to work    Home Environment:apartment, Media planner.    Chief Complaint:(L) leg pain  Patient's Goals:back to playing sports. Wants to return to rugby      SUBJECTIVE   Subjective   Patient states: leg is feeling fine, same old.  Might be traveling and miss the appt on 04/12 and wonders what he should be doing during that time.     Leg feels fine but if walking > an hour - whole leg can feel tight and discomfort.      OBJECTIVE MEASURES / IMPAIRMENTS / ACTIVITY LIMITATIONS:   Objective      Lumbar extension  dominance during squat, deadlifts   - (L) LE symptom(s)       TREATMENT & EDUCATION      Intervention 1:   97110 Therapeutic exercise for 24 minutes       Add in strap for quad to 1/2 kneeling   Straight leg (B) romanian Dead lifts 30 #   Goblet Squat with weight 30 #   SL leg press 75 #   SL leg press sidelying 75 #        HOME EXERCISE PROGRAM  Desensitize training x 5 minutesusing hands/ variety of different textures  SL heel raise3 x 10  SLR gastroc stretch with strap  01/13/2022:  SL hip abduction with knee extension  Squat with blue band - cues to allow hip and knee flexion (knee towards toes OK)  01/20/2022:   Self talar posterior glide lunge with blue tubing   Hip extension mobility/ stretch: 1/2 kneeling lunge with strap for knee flexion/ quad     01/28/2022  Deadlift/ squat with weight     verbal instructions and demonstrations were provided for the above exercises     Time in:   0306   Total Treatment Minutes:   24   Total Timed Code Minutes:  24          ASSESSMENT/ PATIENT RESPONSE TO TREATMENT   Assessment   Kevin Simmons does well with progressive loading today. Given his ability to lift current weight, BFR no longer indicated. Given that Kevin Simmons is lifting close to maximal clinic  weight, he should join a gym for ongoing progressive loading in addition to PHYSICAL THERAPY in order to work toward his functional goals.          PLAN     Plan    Add more weight!   Discuss rules of weight lifting/ symptom(s)     SL leg press   SL leg press sidelying   Lunges   Step ups   Frontal plane       Ask ortho trauma about plyometrics        Donne Anon, PT, DPT, OCS  [facility]  Phone: [phone]   FAX: [phone]

## 2022-01-27 ENCOUNTER — Ambulatory Visit (HOSPITAL_BASED_OUTPATIENT_CLINIC_OR_DEPARTMENT_OTHER): Payer: No Typology Code available for payment source | Admitting: Rehabilitative and Restorative Service Providers"

## 2022-01-27 DIAGNOSIS — S82422K Displaced transverse fracture of shaft of left fibula, subsequent encounter for closed fracture with nonunion: Secondary | ICD-10-CM

## 2022-01-27 DIAGNOSIS — R262 Difficulty in walking, not elsewhere classified: Secondary | ICD-10-CM

## 2022-02-01 NOTE — Progress Notes (Unsigned)
PHYSICAL THERAPY PROGRESS NOTE     VISITS FROM SOC: Visit count could not be calculated. Make sure you are using a visit which is associated with an episode.    PLAN OF CARE DUE: 02/27/22    INTERPRETER STATUS: not needed    Referring Provider: Nichole S Torrado   [x]????Internal or []????External Provider  Referral01/18/2023:21 year old male. Reason for Referral: s/p treatment of non-union left fibula shaft fracture on 10/01/21  L&I Claim Number: None    Diagnosis:closed displaced transverse fracture of shaft of (L) fibula with non union  PRECAUTIONS:11/18/2021:LLE WBAT, ROMAT  Mechanism of Injury:initial injury was playing rugby s/p surgery. Followed by secondary surgerys/p treatment of non-union left fibula shaft fracture on 10/01/21    Pertinent Medical/Surgical History that may affect progress in Physical Therapy:      Patient Active Problem List   Diagnosis   . 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 Fracture line is is still visible. No hardware complication.  Redemonstration of intramedullary rod with proximal and distal locking screws transfixing distal tibial diaphyseal fracture with appropriate progress towards healing. No hardware complication.     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    Chief Complaint:(L) leg pain  Patient's Goals:back to playing sports. Wants to return to rugby      SUBJECTIVE   Subjective   Patient's Statement: ***    Ask ortho trauma about plyometrics      OBJECTIVE MEASURES / IMPAIRMENTS / ACTIVITY LIMITATIONS:   Objective   Pain: ***/10    Ankle/Foot ROM  (R) ankle/foot  Dorsiflexion with knee extended: -15 degrees  Dorsiflexion with knee  flexed: -10 degrees  Plantarflexion: 55 degrees  Forefoot eversion: 20 degrees  Forefoot inversion: 30 degrees  1st MTP dorsiflexion: 80 degrees  1st MTP plantarflexion: 20 degrees    (L) ankle/foot  Dorsiflexion with knee extended: -25 degrees  Dorsiflexion with knee flexed: -12 degrees  Plantarflexion: 45 degrees  Forefoot eversion: 15 degrees  Forefoot inversion: 30 degrees  1st MTP dorsiflexion: 80 degrees  1st MTP plantarflexion: 20 degrees    Gastrocnemius Strength (weightbearing single leg calf raises): 17 (R), 9 (L)  + symptom(s)     Squat:     LE strength:   Hamstring: 5, 4+ (L)   Gluteus Medius: 3 (R), 3- (L)     Hamstring Length (theta): 30 Degrees (L), 22 Degrees (R)   Hip extension ROM: Hip extension: 0 Degrees (L), 10 Degrees (R)     TREATMENT & EDUCATION      Intervention 1:   {PT INTERVENTIONS/SKILLED SERVICES:500251371}Ankle DF MWM closed chain knee extension   SL hip abduction with knee extension  PROGRESS   Intervention 2:   {PT INTERVENTIONS/SKILLED SERVICES:500251371}           HOME EXERCISE PROGRAM  Desensitize training x 5 minutesusing hands/ variety of different textures  SL heel raise3 x 10  SLR gastroc stretch with strap  01/13/2022:  SL hip abduction with knee extension  Squat with blue band - cues to allow hip and knee flexion (knee towards toes OK)  01/20/2022:   Self talar posterior glide lunge with blue tubing  Hip extension mobility/ stretch: 1/2 kneeling lunge with strap for knee flexion/   quad    01/28/2022  Deadlift/ squat with weight     {PT EDUCATION ([place]/ETC):105852}    Time in:   ***  Total Treatment Minutes:   ***   Total Timed Code Minutes:  ***          ASSESSMENT/ PATIENT RESPONSE TO TREATMENT   Assessment   *** 21 year old male has participated in Visit count could not be calculated. Make sure you are using a visit which is associated with an episode. physical therapy visits for ***.  Patient reports subjective progress and demonstrates objective progress toward  their functional goals. *** Patient would benefit from on-going skilled physical therapy to focus on *** and continue to progress toward their functional goals below.       PHYSICAL THERAPY GOALS     Goals and Current Level of Function, Updated on 02/01/2022  Short Term Goals to address Activity Limitations, to be met on 03/03/22  . Patient to demonstrate -10 degrees of ankle DF with knee extended (L) to facilitate normal gait mechanics.    . Pt to demonstrate 15 SL heel raises to full height (L)  for normal gait mechanics.   . Pt to demonstrate squat > 90 Degrees hip and knee flexion for return to gym physical activity.   . Patient to be indep with HEP 4 days a week as needed for self-management of symptoms    Updated Anticipated date of discharge: ***       PLAN     Plan   Add more weight!   Discuss rules of weight lifting/ symptom(s)     SL leg press   SL leg press sidelying  Lunges   Step ups   Frontal plane                 Donne Anon, PT, DPT, OCS  [facility]  Phone: [phone]   FAX: [phone]

## 2022-02-02 ENCOUNTER — Ambulatory Visit
Payer: No Typology Code available for payment source | Attending: Registered Nurse | Admitting: Rehabilitative and Restorative Service Providers"

## 2022-02-02 DIAGNOSIS — S82422K Displaced transverse fracture of shaft of left fibula, subsequent encounter for closed fracture with nonunion: Secondary | ICD-10-CM | POA: Insufficient documentation

## 2022-02-02 DIAGNOSIS — R262 Difficulty in walking, not elsewhere classified: Secondary | ICD-10-CM | POA: Insufficient documentation

## 2022-02-03 ENCOUNTER — Ambulatory Visit
Admission: RE | Admit: 2022-02-03 | Discharge: 2022-02-03 | Disposition: A | Discharge status: Home / Self Care | Payer: No Typology Code available for payment source | Attending: Diagnostic Radiology | Admitting: Diagnostic Radiology

## 2022-02-03 ENCOUNTER — Ambulatory Visit (HOSPITAL_BASED_OUTPATIENT_CLINIC_OR_DEPARTMENT_OTHER): Payer: No Typology Code available for payment source | Admitting: Unknown Physician Specialty

## 2022-02-03 ENCOUNTER — Encounter (HOSPITAL_BASED_OUTPATIENT_CLINIC_OR_DEPARTMENT_OTHER): Payer: Self-pay | Admitting: Orthopaedic Surgery

## 2022-02-03 VITALS — BP 108/63 | HR 67 | Temp 97.2°F | Resp 18 | Ht 70.0 in | Wt 152.0 lb

## 2022-02-03 DIAGNOSIS — S82422K Displaced transverse fracture of shaft of left fibula, subsequent encounter for closed fracture with nonunion: Secondary | ICD-10-CM | POA: Insufficient documentation

## 2022-02-03 DIAGNOSIS — S82202D Unspecified fracture of shaft of left tibia, subsequent encounter for closed fracture with routine healing: Secondary | ICD-10-CM | POA: Insufficient documentation

## 2022-02-03 NOTE — Progress Notes (Signed)
Orthopaedic Surgery Clinic Follow Up    Date of Service: 02/03/22  Attending of Record: Freida Busman, MD  Orthopaedic Service: [facility]    Procedures:  Open Treatment, Proximal Or Shaft Fibula Fracture, With Fixation - Left - 10/01/2021 Freida Busman, MD)    Interval History:   Patient presents now approximately 4 months status post left fibular shaft nonunion repair.  He was last evaluated in our clinic on 11/18/2021 and was overall doing okay at that time, endorsing ongoing neuropathy although improving as well as moderate pain over the left lower extremity, worse with weightbearing.  At today's visit, the patient continues to weight-bear as tolerated however he states he is not quite back to his preoperative baseline.  When asked to point to the location of the pain he points to the back of his calf around the fracture site.  He is able to run occasionally, limited by mild pain.  He has been attending physical therapy 1 time per week with ongoing improvements.  He denies presence of fevers or chills at today's visit.      Physical Exam:  BP 108/63   Pulse 67   Temp 36.2 C (Temporal)   Resp 18   Ht 5\' 10"  (1.778 m)   Wt 68.9 kg (152 lb)   SpO2 97%   BMI 21.81 kg/m     General: Sitting comfortably with no acute complaints   Respiratory: Speaking in full sentences, symmetrical movement of chest on inspiration and expiration, no increased work of breathing or accessory muscle use  Neuro: Alert and oriented, answering questions appropriately, able to follow commands      Left Lower Extremity  Well-healed postsurgical incisions, 1/2 sensation along saphenous, sural, superficial peroneal, deep peroneal, tibial nerve distribution, improving from prior visit.  Fires EHL/FHL on command.  Knee range of motion from 0 to 120 degrees.  Toes are warm well perfused.    Imaging:  Radiographs of the left tibia/fibula reviewed at today's visit which demonstrate postsurgical changes consistent with intramedullary  nailing of the tibia and plate fixation of the fibula.  There is evidence of interval callus formation/bridging callus over the fibula, no evidence of hardware failure or screw loosening.  Tibia appears fused fully.    Medications:   Current Outpatient Medications   Medication Sig Dispense Refill   . acetaminophen 500 MG tablet Take 1 tablet (500 mg) by mouth every 6 hours as needed for pain. 90 tablet 0   . ALPRAZolam 0.5 MG tablet Take 1.5 tablets (0.75 mg) by mouth 2 times a day.     . amphetamine-dextroAMPHetamine ER 20 MG 24 hr capsule Take 1 capsule (20 mg) by mouth every morning.     . naloxone 4 MG/0.1ML nasal spray Use 1 spray in one nostril for suspected opioid overdose. Call 911. If unresponsive in 2 to 3 minutes, repeat with new naloxone nasal spray. 2 each 0     No current facility-administered medications for this visit.        Assessment and Plan:   21 year old  male who returns to clinic 4 months s/p nonunion repair of left fibula, approximately 1.5 years from index procedure at outside hospital.  He continues to have mild pain over the fracture site however his radiographs demonstrate evidence of interval callus formation.  Discussed with patient his radiographic findings and encouraged him that he should continue to weight-bear as tolerated/cyclically load his fracture to continue to promote healing.  We additionally recommend ongoing  physical therapy for strengthening with the anticipation that his pain/discomfort should continue to improve over time.  Patient expresses understanding and agreement.  - Weightbearing as tolerated to left lower extremity  - Recommend continuing physical therapy with ultrasound and massage modalities  - Follow-up in 3 months with repeat x-rays at that time     This patient was discussed with Freida Busman, MD who was in agreement with the plan as stated above.     Leonidas Romberg  PGY2, Orthopaedic Surgery and Sports Medicine

## 2022-02-05 NOTE — Progress Notes (Signed)
I, Norval Leyan Branden, saw and evaluated the patient. I have reviewed the resident's documentation and agree.

## 2022-02-10 ENCOUNTER — Encounter (HOSPITAL_BASED_OUTPATIENT_CLINIC_OR_DEPARTMENT_OTHER): Payer: No Typology Code available for payment source | Admitting: Rehabilitative and Restorative Service Providers"

## 2022-03-11 ENCOUNTER — Encounter (HOSPITAL_BASED_OUTPATIENT_CLINIC_OR_DEPARTMENT_OTHER): Payer: No Typology Code available for payment source | Admitting: Rehabilitative and Restorative Service Providers"

## 2022-03-16 ENCOUNTER — Encounter (HOSPITAL_BASED_OUTPATIENT_CLINIC_OR_DEPARTMENT_OTHER): Payer: Self-pay

## 2022-05-24 ENCOUNTER — Telehealth (HOSPITAL_BASED_OUTPATIENT_CLINIC_OR_DEPARTMENT_OTHER): Payer: Self-pay

## 2022-05-24 NOTE — Telephone Encounter (Signed)
RETURN CALL: Voicemail - Detailed Message      SUBJECT:  Appointment Request     REASON FOR VISIT: Patient is requesting a post op visit. He "still feels the same" as he did post surgery, expecting to be more mobile by now  PREFERRED DATE/TIME: late morning, no Wednesday 12-1  REASON UNABLE TO APPOINT: No appts available

## 2022-05-26 NOTE — Telephone Encounter (Signed)
Lvm for pt letting them know about future apt. This is the soonest available, and pt Dr is only in clinic on wednesdays.

## 2022-05-31 ENCOUNTER — Other Ambulatory Visit (HOSPITAL_BASED_OUTPATIENT_CLINIC_OR_DEPARTMENT_OTHER): Payer: Self-pay | Admitting: Rehabilitative and Restorative Service Providers"

## 2022-06-03 ENCOUNTER — Encounter (HOSPITAL_BASED_OUTPATIENT_CLINIC_OR_DEPARTMENT_OTHER): Payer: Self-pay

## 2022-06-03 ENCOUNTER — Ambulatory Visit
Payer: No Typology Code available for payment source | Attending: Registered Nurse | Admitting: Rehabilitative and Restorative Service Providers"

## 2022-06-03 DIAGNOSIS — S82422K Displaced transverse fracture of shaft of left fibula, subsequent encounter for closed fracture with nonunion: Secondary | ICD-10-CM | POA: Insufficient documentation

## 2022-06-03 DIAGNOSIS — R262 Difficulty in walking, not elsewhere classified: Secondary | ICD-10-CM | POA: Insufficient documentation

## 2022-06-03 NOTE — Progress Notes (Signed)
PHYSICAL THERAPY PLAN OF CARE REVIEW          VISITS FROM SOC: 6     PLAN OF CARE DUE: 08/02/22  Progress note due: 07/03/22     INTERPRETER  STATUS: not needed      Referring Provider:  Evern Core    _0   Internal or  _1   External Provider  Referral 11/18/2021: 21 year old male. 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 Fracture line is is still visible. No hardware complication.  Redemonstration of intramedullary rod with proximal and distal locking screws transfixing distal tibial diaphyseal fracture with appropriate progress towards healing. No hardware complication.       Prior Level of Function:  Rugby, gym (weigh training)     Occupation: Tourist information centre manager, Gaffer. Sitting at a computer.   Walk or scooter to work      Home Environment: apartment, Media planner.      Chief Complaint: (L) leg pain    Patient's Goals: back to playing sports. Wants to return to rugby     SUBJECTIVE   Subjective   Patient's Statement: told his leg is healing slowly at his last ortho follow up with xr which was 04/05. Not sure if it was healing slowly and then plateau'd. Was in [place] and was exercising in the gym until Mid May then work got busy + life events. The gym was going fine - doing PHYSICAL THERAPY  including mobility, strengthening  (squats, deadlifts, lunges) Would be sore for 2 days after strengthening and on those days would focus more on stretching. Felt like muscles in his leg were getting stronger, better flexibility. But Didn't feel like bone was healing. When asked to describe how he would describe the feeling that is not healing he describes it as pain at the location of his bony fracture. In general leg feels worse since he stopped doing any PHYSICAL THERAPY. Noticed in the last week that it feels his whole body is compensating, he is exhausted.      OBJECTIVE MEASURES / IMPAIRMENTS / ACTIVITY LIMITATIONS:   Objective   Pain: 2-3/10    Observation (standing alignment):  Foot/Ankle: neutral   Knee: extended (L), (R) knee slight bent   Femoral Alignment: MR (B)   Pelvis: level   Spine: WNL     Knee AROM with OP: 0 to 150 Degrees (B)     Proximal Hip Strength:  Posterior Gluteus Medius: (R) 4+; (L) 4-  Gluteus Maximus: 5 (B)   Iliopsoas: 4+ (B)     Muscle Length:  Hamstrings (theta angle): (R) 44 Degrees; (L) 50 Degrees   Quadriceps: WNL, symmetrical (B)   Ankle DF knee extended (gastroc): - 20 Degrees (B)  Ankle DF knee flexed (soleus/ TC joint): (L) -12 Degrees ; (R) -10 Degrees   Hip Extension: 0 Degrees (R), 10 Degrees (L)     Muscle Strength:  Hamstrings: 5 (B)   Quadriceps: 5 (B)     Functional Tests:  Standing lumbopelvic rotation: normal supination/ pronation   Single Leg Stance:  WNL, hip drop (R)   Squat: equal LE ROM, squat hips below knees.   Single Leg Squat: reports pain at bone (L) with SLS. Hip/ knee flexion 45 Degrees     Gait:  WNL       TREATMENT & EDUCATION      Intervention 1:   97110 Therapeutic exercise for 38 minutes       Assessment of subjective progress, see above.  Assessment of progress toward functional goals, see below.  Assessment of objective measures, see above.    SLR gastroc/hamstring stretch with strap     SL hip abduction with knee extension     Intervention 2:   .           HOME EXERCISE  PROGRAM  Slowly getting back into PHYSICAL THERAPY :   SLR gastroc/hamstring stretch with strap     SL hip abduction with knee extension     verbal instructions and demonstrations were provided for the above exercises  a handout was provided describing the exercises in written and picture format    Time in:   1045  Total Treatment Minutes:   38  Total Timed Code Minutes:  10          ASSESSMENT/ PATIENT RESPONSE TO TREATMENT   Assessment   Dianne is a 21 year old male has participated in 6 physical therapy visits for (L) lower leg pain with history of a left tibia and fibular fracture sustained while playing rugby and patient underwent intramedullary nail of the tibia and the fibula was treated nonoperatively cleared 11/2020. Patient was noted to have nonunion of the fibula and subsequently underwent ORIF of fibula 10/01/2021.  Patient returns to PHYSICAL THERAPY  after spending a few months in [place]. He reports compliance to home program and strength training in the gym and noticed improvements in muscle strength and flexibility. However, he feels his bone continues to be painful and is concerned that his bone is not healing. He then subsequently stopped PHYSICAL THERAPY  and gym participation since [facility] May. He has a follow up with Ortho trauma in 2 weeks for updated imaging and we will progress his home program and gym program accordingly. Patient would benefit from on-going skilled physical therapy to focus on LE strengthening and posterior chain mobility to continue to progress toward their functional goals below.         PHYSICAL THERAPY GOALS     Goals and Current Level of Function/ Established/Updated on 06/03/2022  Long Term Goals (Functional Goals) Anticipated Discharge Date 09/01/22  Patient to report ability to perform community distance ambulation without significant pain or difficulty by discharge    Current Level of Function/ Participation Restrictions: with pain at location of previous bone fx      symptom(s) following prolonged walking/ standing/ time on feet.   Patient to report ability to return to recreation activity without significant difficulty by discharge    Current Level of Function/ Participation Restrictions: unable to return to    rugby     Short Term Goals to address Activity Limitations.  Short Term Goals to be Met on 07/03/22  Patient to demonstrate -  10 degrees of ankle DF with knee extended (L) to facilitate normal gait mechanics.  UNMET   Pt to demonstrate 15 SL heel raises to full height (L)  for normal gait mechanics. NA today   Pt to demonstrate squat > 90 Degrees hip and knee flexion for return to gym physical activity. MET previously/ maintained today.   Patient to be indep with HEP 4 days a week as needed for self-management of symptoms ONGOING - returning to PHYSICAL THERAPY  compliance      PLAN     Plan   Therapy Treatment Plan (Frequency/Duration/Other Follow up): Patient will be seen for 2 visits per month  until plan of care reviewed.  To improve impairments and reach functional goals the following interventions will be performed: Patient Education, Manual Therapy [phone]), Therapeutic Exercise [phone]), Therapeutic Activities [phone]), Neuromuscular Re-education [phone]), and Gait Training [phone])  Patient assisted in establishing goals and agrees with plan  Patient's learning needs, abilities, preferences and readiness per Initial POC were considered in this session. Learning verified by teach back.  Pain will be addressed at the next plan of care review, and intermittently during daily visits as deemed appropriate by the provider.  The focus of daily visits will be on function.    PLAN FOR NEXT VISIT(S): gastroc WB strength, progress to return to PHYSICAL THERAPY / gym as able following ortho trauma and imaging.        Donne Anon, PT, DPT, OCS  [facility]  Phone: [phone]   FAX: [phone]

## 2022-06-14 ENCOUNTER — Encounter (HOSPITAL_BASED_OUTPATIENT_CLINIC_OR_DEPARTMENT_OTHER): Payer: Self-pay | Admitting: Orthopaedic Surgery

## 2022-06-16 ENCOUNTER — Ambulatory Visit
Admission: RE | Admit: 2022-06-16 | Discharge: 2022-06-16 | Disposition: A | Discharge status: Home / Self Care | Payer: No Typology Code available for payment source | Attending: Diagnostic Radiology | Admitting: Diagnostic Radiology

## 2022-06-16 ENCOUNTER — Ambulatory Visit (HOSPITAL_BASED_OUTPATIENT_CLINIC_OR_DEPARTMENT_OTHER): Payer: No Typology Code available for payment source | Admitting: Orthopedic Surgery

## 2022-06-16 ENCOUNTER — Encounter (HOSPITAL_BASED_OUTPATIENT_CLINIC_OR_DEPARTMENT_OTHER): Payer: Self-pay | Admitting: Orthopaedic Surgery

## 2022-06-16 VITALS — BP 112/73 | HR 77 | Temp 98.1°F | Ht 70.0 in

## 2022-06-16 DIAGNOSIS — S82422K Displaced transverse fracture of shaft of left fibula, subsequent encounter for closed fracture with nonunion: Secondary | ICD-10-CM

## 2022-06-16 DIAGNOSIS — S82202D Unspecified fracture of shaft of left tibia, subsequent encounter for closed fracture with routine healing: Secondary | ICD-10-CM | POA: Insufficient documentation

## 2022-06-16 NOTE — Progress Notes (Signed)
ORTHOPEDIC CLINIC NOTE    ATTENDING SURGEON: Dr. Everlean Patterson  CHIEF COMPLAINT: L leg pain   RELEVANT SURGICAL HISTORY: 10/01/2021: Left fibular shaft nonunion repair Freida Busman, MD)     History of Present Illness:  Kevin Simmons is a 21 year old male who presents to clinic today for months after his left fibular shaft nonunion repair with Dr. Everlean Patterson.  The patient was last seen in clinic on April 5, and reports that his pain is about the same as it was at his last visit.  He reports that he has pain when he walks and has not been able to return to running.  The patient does not point to a specific place on his leg where he feels the pain but rather describes it as occurring over much of his lower leg.  His pain is well controlled with Tylenol.  The patient is very frustrated because he expected to only require 1 surgery after his injury but has now had a second surgery that has not completely resolved his pain.  He also expresses significant distress about his current situation at work, explaining that he is very stressed and overworked and he believes that this is preventing him from taking the time he needs to heal his bone.  He is currently undergoing diagnostic evaluation with a mental health provider for autism and other mental health conditions.  He has not been able to work with physical therapy since his last visit in clinic due to these stressors and time limitations.     Patient was not seen with attending today.     Past Medical history  Past Medical History:   Diagnosis Date    Fracture        Past Surgical history  Past Surgical History:   Procedure Laterality Date    IM Nail Tib/Fib Left     LAPAROSCOPIC ORCHIOPEXY 2002      NASAL SEPTUM SURGERY      TONSILLECTOMY AND ADENOIDECTOMY      TOTAL SHOULDER ARTHROPLASTY      WISDOM TOOTH EXTRACTION         Medications  Outpatient Medications Prior to Visit   Medication Sig Dispense Refill    acetaminophen 500 MG tablet Take 1 tablet (500 mg) by mouth  every 6 hours as needed for pain. 90 tablet 0    Albuterol Sulfate 108 (90 Base) MCG/ACT aerosol powder breath-activated albuterol inhaler prn   Active      ALPRAZolam 0.5 MG tablet Take 1.5 tablets (0.75 mg) by mouth 2 times a day.      amphetamine-dextroAMPHetamine ER 20 MG 24 hr capsule Take 1 capsule (20 mg) by mouth every morning.      naloxone 4 MG/0.1ML nasal spray Use 1 spray in one nostril for suspected opioid overdose. Call 911. If unresponsive in 2 to 3 minutes, repeat with new naloxone nasal spray. 2 each 0    tadalafil 5 MG tablet Take 1 tablet (5 mg) by mouth daily.       No facility-administered medications prior to visit.       Allergy  Review of patient's allergies indicates:  Allergies   Allergen Reactions    Aspirin Skin: Hives and Unknown    Daucus Carota Unknown    Ibuprofen Skin: Hives, Skin: Itching and Unknown    Levonorgestrel-Ethinyl Estrad Unknown    Other (See Comments) Skin: Itching and Unknown    Pollen Extract Unknown       Review of Systems:  A complete review of systems was performed. Pertinent positives are noted in the HPI above, with all other systems negative in relation to the chief complaint    Physical Exam:  BP 112/73   Pulse 77   Temp 36.7 C (Temporal)   Ht 5\' 10"  (1.778 m)   BMI 21.81 kg/m   General: Well nourished, well developed. In no acute distress. Appears stated age.  Respiratory: Breathing comfortably on room air. Bilateral symmetric chest rise. No extra work of breathing.   Neuro: Alert. Responding to questions appropriately    Left Lower Extremity  Inspection: No gross deformity.  No open wounds.  Surgical incision is well healed without any areas of erythema or dehiscence.  Palpation: No gross crepitus to palpation.  Notable for tenderness to palpation of the left leg at the level of the fracture.  Range of Motion: Painless ROM at the hip, knee, and ankle  Motor: Fires tibialis anterior, gastrocnemius - soleus complex, flexor hallucis longus, flexor  digitorum longus, extensor hallucis longus, and extensor digitorum longus against gravity.  Sensory: Sensation intact to light touch deep peroneal, superficial peroneal, tibial, sural, and saphenous distributions  Vascular: warm and well perfused    Imaging:    X-rays of the L tib/fib were obtained today in clinic and independently reviewed showing:     -- well placed hardware without signs of loosening or failure  -- anatomic alignment of the L tib/fib with bridging callous, although incomplete healing at the fracture line    Images were reviewed and discussed with Dr.    Assessment:  Kevin Simmons is a 21 year old male who is 8 months status post left fibular shaft nonunion repair.  He is continuing to report some pain, and imaging shows incomplete healing at the site of the fracture.  The patient also demonstrates some poor coping skills with his pain, and is experiencing some significant stress surrounding his job and personal life.    Plan:   - we will get a CT scan of his L tib/fib to better visualize his fracture/nonunion, and have him follow up in clinic on the same day to discuss options for management  - patient was encouraged to discuss home stressors with his mental health provider    Activity: WBAT, ROMAT LLE  DVT Prophylaxis: none  Future Imaging: CT L tib/fib   Follow-up: f/u in clinic on 9/13 after obtaining CT scan     It was a pleasure to see Kevin Simmons in clinic today. The patient understands and agrees with this plan and all questions were answered to his satisfaction today. The patient knows to contact our clinic with any further questions or concerns.     Phillip Heal. Araceli Bouche, MD  PGY-1, Orthopaedics & Sports Medicine  06/16/22

## 2022-06-19 NOTE — Progress Notes (Signed)
I, Daymion Titus Drone, did not see the patient, but have reviewed the findings.

## 2022-06-21 ENCOUNTER — Encounter (HOSPITAL_BASED_OUTPATIENT_CLINIC_OR_DEPARTMENT_OTHER): Payer: Self-pay

## 2022-06-22 ENCOUNTER — Encounter (HOSPITAL_BASED_OUTPATIENT_CLINIC_OR_DEPARTMENT_OTHER): Payer: No Typology Code available for payment source | Admitting: Rehabilitative and Restorative Service Providers"

## 2022-07-14 ENCOUNTER — Ambulatory Visit: Payer: No Typology Code available for payment source

## 2022-07-14 ENCOUNTER — Encounter (HOSPITAL_BASED_OUTPATIENT_CLINIC_OR_DEPARTMENT_OTHER): Payer: Self-pay

## 2022-07-14 ENCOUNTER — Ambulatory Visit (HOSPITAL_BASED_OUTPATIENT_CLINIC_OR_DEPARTMENT_OTHER): Payer: No Typology Code available for payment source | Admitting: Orthopaedic Surgery
